# Patient Record
Sex: Male | Born: 1950 | Race: White | Hispanic: No | Marital: Married | Smoking: Never smoker
Health system: Southern US, Community
[De-identification: ages and names within clinical notes are randomized; demographics above are authoritative.]

## PROBLEM LIST (undated history)

## (undated) DIAGNOSIS — I219 Acute myocardial infarction, unspecified: Secondary | ICD-10-CM

## (undated) DIAGNOSIS — D128 Benign neoplasm of rectum: Secondary | ICD-10-CM

## (undated) DIAGNOSIS — F419 Anxiety disorder, unspecified: Secondary | ICD-10-CM

## (undated) DIAGNOSIS — K760 Fatty (change of) liver, not elsewhere classified: Secondary | ICD-10-CM

## (undated) DIAGNOSIS — I509 Heart failure, unspecified: Secondary | ICD-10-CM

## (undated) DIAGNOSIS — I2510 Atherosclerotic heart disease of native coronary artery without angina pectoris: Secondary | ICD-10-CM

## (undated) DIAGNOSIS — E785 Hyperlipidemia, unspecified: Secondary | ICD-10-CM

## (undated) DIAGNOSIS — I639 Cerebral infarction, unspecified: Secondary | ICD-10-CM

## (undated) DIAGNOSIS — F329 Major depressive disorder, single episode, unspecified: Secondary | ICD-10-CM

## (undated) DIAGNOSIS — F32A Depression, unspecified: Secondary | ICD-10-CM

## (undated) DIAGNOSIS — I10 Essential (primary) hypertension: Secondary | ICD-10-CM

## (undated) DIAGNOSIS — K611 Rectal abscess: Secondary | ICD-10-CM

## (undated) HISTORY — DX: Acute myocardial infarction, unspecified: I21.9

## (undated) HISTORY — DX: Major depressive disorder, single episode, unspecified: F32.9

## (undated) HISTORY — DX: Cerebral infarction, unspecified: I63.9

## (undated) HISTORY — DX: Atherosclerotic heart disease of native coronary artery without angina pectoris: I25.10

## (undated) HISTORY — DX: Rectal abscess: K61.1

## (undated) HISTORY — DX: Fatty (change of) liver, not elsewhere classified: K76.0

## (undated) HISTORY — DX: Benign neoplasm of rectum: D12.8

## (undated) HISTORY — PX: RECTAL SURGERY: SHX760

## (undated) HISTORY — DX: Hyperlipidemia, unspecified: E78.5

## (undated) HISTORY — DX: Depression, unspecified: F32.A

## (undated) HISTORY — DX: Anxiety disorder, unspecified: F41.9

## (undated) HISTORY — DX: Heart failure, unspecified: I50.9

## (undated) HISTORY — DX: Essential (primary) hypertension: I10

## (undated) HISTORY — PX: HERNIA REPAIR: SHX51

---

## 1998-11-13 DIAGNOSIS — I219 Acute myocardial infarction, unspecified: Secondary | ICD-10-CM

## 1998-11-13 HISTORY — DX: Acute myocardial infarction, unspecified: I21.9

## 2003-11-14 HISTORY — PX: CORONARY ANGIOPLASTY WITH STENT PLACEMENT: SHX49

## 2004-11-13 DIAGNOSIS — I639 Cerebral infarction, unspecified: Secondary | ICD-10-CM

## 2004-11-13 HISTORY — DX: Cerebral infarction, unspecified: I63.9

## 2006-07-06 ENCOUNTER — Ambulatory Visit: Payer: Self-pay | Admitting: Internal Medicine

## 2006-07-13 ENCOUNTER — Ambulatory Visit: Payer: Self-pay | Admitting: Internal Medicine

## 2006-07-24 ENCOUNTER — Encounter: Payer: Self-pay | Admitting: Cardiology

## 2006-07-24 ENCOUNTER — Ambulatory Visit: Payer: Self-pay | Admitting: Cardiology

## 2006-07-24 ENCOUNTER — Ambulatory Visit: Payer: Self-pay

## 2006-07-26 ENCOUNTER — Ambulatory Visit: Payer: Self-pay | Admitting: Gastroenterology

## 2006-07-30 ENCOUNTER — Ambulatory Visit: Payer: Self-pay | Admitting: Internal Medicine

## 2006-08-13 ENCOUNTER — Ambulatory Visit: Payer: Self-pay | Admitting: Cardiology

## 2006-08-13 DIAGNOSIS — D128 Benign neoplasm of rectum: Secondary | ICD-10-CM

## 2006-08-13 HISTORY — DX: Benign neoplasm of rectum: D12.8

## 2006-08-17 ENCOUNTER — Ambulatory Visit: Payer: Self-pay | Admitting: Gastroenterology

## 2006-08-17 ENCOUNTER — Encounter (INDEPENDENT_AMBULATORY_CARE_PROVIDER_SITE_OTHER): Payer: Self-pay | Admitting: Specialist

## 2006-08-30 ENCOUNTER — Ambulatory Visit: Payer: Self-pay | Admitting: Cardiology

## 2006-09-20 ENCOUNTER — Ambulatory Visit: Payer: Self-pay | Admitting: *Deleted

## 2006-09-25 ENCOUNTER — Ambulatory Visit: Payer: Self-pay | Admitting: Cardiology

## 2006-10-01 ENCOUNTER — Ambulatory Visit: Payer: Self-pay | Admitting: Cardiology

## 2006-10-11 ENCOUNTER — Ambulatory Visit: Payer: Self-pay | Admitting: Cardiology

## 2006-11-14 ENCOUNTER — Ambulatory Visit: Payer: Self-pay | Admitting: Family Medicine

## 2006-11-22 ENCOUNTER — Ambulatory Visit: Payer: Self-pay | Admitting: Family Medicine

## 2006-11-28 ENCOUNTER — Ambulatory Visit: Payer: Self-pay | Admitting: Family Medicine

## 2006-11-28 LAB — CONVERTED CEMR LAB
ALT: 38 units/L (ref 0–53)
Alkaline Phosphatase: 48 units/L (ref 39–117)
Basophils Absolute: 0 10*3/uL (ref 0.0–0.1)
Eosinophils Relative: 2 % (ref 0–5)
HCT: 43 % (ref 39.0–52.0)
Hemoglobin: 14.6 g/dL (ref 13.0–17.0)
LDL Cholesterol: 43 mg/dL (ref 0–99)
MCHC: 34 g/dL (ref 30.0–36.0)
Monocytes Absolute: 0.5 10*3/uL (ref 0.2–0.7)
PSA: 0.46 ng/mL (ref 0.10–4.00)
RDW: 12.2 % (ref 11.5–14.0)
Sodium: 133 meq/L — ABNORMAL LOW (ref 135–145)
Total Bilirubin: 0.8 mg/dL (ref 0.3–1.2)
Total Protein: 7 g/dL (ref 6.0–8.3)
VLDL: 33 mg/dL (ref 0–40)

## 2006-12-03 ENCOUNTER — Encounter: Payer: Self-pay | Admitting: Gastroenterology

## 2006-12-03 LAB — CONVERTED CEMR LAB
OCCULT 1: NEGATIVE
OCCULT 5: NEGATIVE

## 2006-12-04 ENCOUNTER — Encounter: Payer: Self-pay | Admitting: Family Medicine

## 2006-12-04 DIAGNOSIS — Z860100 Personal history of colon polyps, unspecified: Secondary | ICD-10-CM | POA: Insufficient documentation

## 2006-12-04 DIAGNOSIS — F3289 Other specified depressive episodes: Secondary | ICD-10-CM | POA: Insufficient documentation

## 2006-12-04 DIAGNOSIS — E785 Hyperlipidemia, unspecified: Secondary | ICD-10-CM

## 2006-12-04 DIAGNOSIS — G5600 Carpal tunnel syndrome, unspecified upper limb: Secondary | ICD-10-CM

## 2006-12-04 DIAGNOSIS — I509 Heart failure, unspecified: Secondary | ICD-10-CM | POA: Insufficient documentation

## 2006-12-04 DIAGNOSIS — F329 Major depressive disorder, single episode, unspecified: Secondary | ICD-10-CM

## 2006-12-04 DIAGNOSIS — J309 Allergic rhinitis, unspecified: Secondary | ICD-10-CM | POA: Insufficient documentation

## 2006-12-04 DIAGNOSIS — I6350 Cerebral infarction due to unspecified occlusion or stenosis of unspecified cerebral artery: Secondary | ICD-10-CM | POA: Insufficient documentation

## 2006-12-04 DIAGNOSIS — I2510 Atherosclerotic heart disease of native coronary artery without angina pectoris: Secondary | ICD-10-CM | POA: Insufficient documentation

## 2006-12-04 DIAGNOSIS — I252 Old myocardial infarction: Secondary | ICD-10-CM | POA: Insufficient documentation

## 2006-12-04 DIAGNOSIS — I10 Essential (primary) hypertension: Secondary | ICD-10-CM | POA: Insufficient documentation

## 2006-12-04 DIAGNOSIS — Q211 Atrial septal defect: Secondary | ICD-10-CM

## 2006-12-05 ENCOUNTER — Ambulatory Visit: Payer: Self-pay | Admitting: Family Medicine

## 2006-12-19 ENCOUNTER — Ambulatory Visit: Payer: Self-pay | Admitting: Cardiology

## 2006-12-25 ENCOUNTER — Ambulatory Visit: Payer: Self-pay | Admitting: Family Medicine

## 2006-12-25 DIAGNOSIS — I2589 Other forms of chronic ischemic heart disease: Secondary | ICD-10-CM

## 2006-12-25 DIAGNOSIS — E871 Hypo-osmolality and hyponatremia: Secondary | ICD-10-CM | POA: Insufficient documentation

## 2006-12-25 DIAGNOSIS — G4700 Insomnia, unspecified: Secondary | ICD-10-CM

## 2006-12-25 DIAGNOSIS — F4321 Adjustment disorder with depressed mood: Secondary | ICD-10-CM

## 2006-12-25 LAB — CONVERTED CEMR LAB
Cholesterol, target level: 200 mg/dL
LDL Goal: 100 mg/dL

## 2007-01-11 ENCOUNTER — Telehealth (INDEPENDENT_AMBULATORY_CARE_PROVIDER_SITE_OTHER): Payer: Self-pay | Admitting: Family Medicine

## 2007-01-11 ENCOUNTER — Ambulatory Visit: Payer: Self-pay | Admitting: Family Medicine

## 2007-01-11 LAB — CONVERTED CEMR LAB: Prothrombin Time: 22.3 s

## 2007-01-16 ENCOUNTER — Ambulatory Visit: Payer: Self-pay | Admitting: Family Medicine

## 2007-01-31 ENCOUNTER — Encounter (INDEPENDENT_AMBULATORY_CARE_PROVIDER_SITE_OTHER): Payer: Self-pay | Admitting: Family Medicine

## 2007-01-31 ENCOUNTER — Telehealth (INDEPENDENT_AMBULATORY_CARE_PROVIDER_SITE_OTHER): Payer: Self-pay | Admitting: Family Medicine

## 2007-02-01 ENCOUNTER — Ambulatory Visit: Payer: Self-pay | Admitting: Cardiology

## 2007-02-01 ENCOUNTER — Observation Stay (HOSPITAL_COMMUNITY): Admission: EM | Admit: 2007-02-01 | Discharge: 2007-02-01 | Payer: Self-pay | Admitting: Emergency Medicine

## 2007-02-01 ENCOUNTER — Encounter (INDEPENDENT_AMBULATORY_CARE_PROVIDER_SITE_OTHER): Payer: Self-pay | Admitting: Family Medicine

## 2007-02-04 ENCOUNTER — Encounter (INDEPENDENT_AMBULATORY_CARE_PROVIDER_SITE_OTHER): Payer: Self-pay | Admitting: Family Medicine

## 2007-02-08 ENCOUNTER — Telehealth (INDEPENDENT_AMBULATORY_CARE_PROVIDER_SITE_OTHER): Payer: Self-pay | Admitting: Internal Medicine

## 2007-02-15 ENCOUNTER — Telehealth (INDEPENDENT_AMBULATORY_CARE_PROVIDER_SITE_OTHER): Payer: Self-pay | Admitting: Family Medicine

## 2007-02-19 ENCOUNTER — Ambulatory Visit: Payer: Self-pay | Admitting: Family Medicine

## 2007-02-21 ENCOUNTER — Ambulatory Visit: Payer: Self-pay | Admitting: Cardiology

## 2007-02-27 ENCOUNTER — Ambulatory Visit: Payer: Self-pay

## 2007-02-27 ENCOUNTER — Encounter (INDEPENDENT_AMBULATORY_CARE_PROVIDER_SITE_OTHER): Payer: Self-pay | Admitting: Family Medicine

## 2007-03-01 ENCOUNTER — Telehealth (INDEPENDENT_AMBULATORY_CARE_PROVIDER_SITE_OTHER): Payer: Self-pay | Admitting: Family Medicine

## 2007-03-06 ENCOUNTER — Ambulatory Visit: Payer: Self-pay | Admitting: Family Medicine

## 2007-03-06 LAB — CONVERTED CEMR LAB: INR: 3.6

## 2007-03-08 ENCOUNTER — Ambulatory Visit: Payer: Self-pay | Admitting: Cardiology

## 2007-03-15 ENCOUNTER — Telehealth (INDEPENDENT_AMBULATORY_CARE_PROVIDER_SITE_OTHER): Payer: Self-pay | Admitting: Family Medicine

## 2007-03-15 ENCOUNTER — Ambulatory Visit: Payer: Self-pay | Admitting: Family Medicine

## 2007-03-15 LAB — CONVERTED CEMR LAB
INR: 2
Prothrombin Time: 17.6 s

## 2007-03-22 ENCOUNTER — Ambulatory Visit: Payer: Self-pay | Admitting: Family Medicine

## 2007-03-22 DIAGNOSIS — F319 Bipolar disorder, unspecified: Secondary | ICD-10-CM | POA: Insufficient documentation

## 2007-03-22 LAB — CONVERTED CEMR LAB: INR: 1.5

## 2007-03-25 ENCOUNTER — Ambulatory Visit: Payer: Self-pay | Admitting: Family Medicine

## 2007-03-26 ENCOUNTER — Telehealth (INDEPENDENT_AMBULATORY_CARE_PROVIDER_SITE_OTHER): Payer: Self-pay | Admitting: Family Medicine

## 2007-03-27 ENCOUNTER — Telehealth (INDEPENDENT_AMBULATORY_CARE_PROVIDER_SITE_OTHER): Payer: Self-pay | Admitting: Family Medicine

## 2007-04-01 ENCOUNTER — Ambulatory Visit: Payer: Self-pay | Admitting: Family Medicine

## 2007-04-01 LAB — CONVERTED CEMR LAB: Prothrombin Time: 24.6 s

## 2007-04-05 ENCOUNTER — Telehealth (INDEPENDENT_AMBULATORY_CARE_PROVIDER_SITE_OTHER): Payer: Self-pay | Admitting: Family Medicine

## 2007-04-11 ENCOUNTER — Telehealth (INDEPENDENT_AMBULATORY_CARE_PROVIDER_SITE_OTHER): Payer: Self-pay | Admitting: Family Medicine

## 2007-04-29 ENCOUNTER — Telehealth (INDEPENDENT_AMBULATORY_CARE_PROVIDER_SITE_OTHER): Payer: Self-pay | Admitting: Family Medicine

## 2007-05-01 ENCOUNTER — Telehealth (INDEPENDENT_AMBULATORY_CARE_PROVIDER_SITE_OTHER): Payer: Self-pay | Admitting: Family Medicine

## 2007-05-03 ENCOUNTER — Encounter (INDEPENDENT_AMBULATORY_CARE_PROVIDER_SITE_OTHER): Payer: Self-pay | Admitting: Family Medicine

## 2007-05-03 ENCOUNTER — Encounter: Payer: Self-pay | Admitting: Family Medicine

## 2007-05-03 ENCOUNTER — Ambulatory Visit: Payer: Self-pay | Admitting: Family Medicine

## 2007-05-10 ENCOUNTER — Telehealth (INDEPENDENT_AMBULATORY_CARE_PROVIDER_SITE_OTHER): Payer: Self-pay | Admitting: Family Medicine

## 2007-05-10 ENCOUNTER — Encounter (INDEPENDENT_AMBULATORY_CARE_PROVIDER_SITE_OTHER): Payer: Self-pay | Admitting: Family Medicine

## 2007-08-27 ENCOUNTER — Emergency Department (HOSPITAL_COMMUNITY): Admission: EM | Admit: 2007-08-27 | Discharge: 2007-08-27 | Payer: Self-pay | Admitting: Emergency Medicine

## 2007-09-13 ENCOUNTER — Ambulatory Visit: Payer: Self-pay | Admitting: *Deleted

## 2007-09-13 ENCOUNTER — Emergency Department (HOSPITAL_COMMUNITY): Admission: EM | Admit: 2007-09-13 | Discharge: 2007-09-14 | Payer: Self-pay | Admitting: Emergency Medicine

## 2007-09-14 ENCOUNTER — Inpatient Hospital Stay (HOSPITAL_COMMUNITY): Admission: RE | Admit: 2007-09-14 | Discharge: 2007-09-18 | Payer: Self-pay | Admitting: *Deleted

## 2011-03-28 NOTE — Assessment & Plan Note (Signed)
[facility]                            CARDIOLOGY OFFICE NOTE   NAME:[name], [name]                       MRN:          [id number]  DATE:03/08/2007                            DOB:          [birth year]    PRIMARY CARE PHYSICIAN:  Dr. Amador Cunas.   REASON FOR PRESENTATION:  Evaluate patient with coronary disease and  recent chest pain.   HISTORY OF PRESENT ILLNESS:  Patient presents for followup of the above.  He had a stress perfusion study on April 16 that demonstrated an old  apical and anteroseptal infarct with an ejection fraction of about 38%.  This was not changed from previous. He has had no new complaints since I  last saw. He has had no severe symptoms of chest discomfort of shortness  of breath. He is still under considerable amount of stress.   PAST MEDICAL HISTORY:  Coronary artery disease ( see the February 21, 2007  note for details), cardiomyopathy (ejection fraction approximately 40%),  previously cerebrovascular accident, dyslipidemia, right knee surgery,  tonsillectomy, bilateral hernia repair, obsessive compulsive disorder,  anxiety.   ALLERGIES:  ERYTHROMYCIN AND PENICILLIN.   MEDICATIONS:  1. Aspirin 81 mg daily.  2. Omega III.  3. Coumadin.  4. Niaspan 500 mg daily.  5. Simvastatin 20 mg daily.  6. Coreg 25 mg b.i.d.  7. Zoloft 50 mg daily.  8. Lisinopril 10 mg b.i.d.   REVIEW OF SYSTEMS:  As stated in the HPI and otherwise negative for  other systems.   PHYSICAL EXAMINATION:  Patient is in no distress. Blood pressure 116/76,  heart rate 67 and regular, weight 179, body mass index 28.  NECK: No jugular venous distension, wave form within normal limits.  Carotid upstrokes brisk and symmetric. No bruits or thyromegaly.  LYMPHATICS: No cervical, axillary, or inguinal adenopathy.  LUNGS: Clear to auscultation bilaterally.  BACK: No costovertebral angle tenderness.  CHEST: Unremarkable.  HEART: PMI not displaced or  sustained. S1 and S2 within normal limits.  No S3. No S4, clicks, rubs, or murmurs.  ABDOMEN: Flat, positive bowel sounds normal in frequency and pitch. No  bruits, rebound, or guarding.  SKIN: No rashes. No nodules.  EXTREMITIES: 2 + pulses throughout. No edema.  NEURO: Grossly intact.   1. Coronary disease, patient has no new symptoms. The Cardiolite would      not suggest new high grade obstructive coronary disease requiring      catheterization. No further cardiovascular testing is suggested. He      will continue with secondary risk reduction.  2. Cardiomyopathy, he is on a reasonable medical regimen. I will leave      him on this regimen.  3. Status post CVA, the patient has had CVA and is on chronic Coumadin      therapy. He will continue with this.  4. Follow up, I will see the patient back in 6 months or sooner if      needed.     Rollene Rotunda, MD, [facility]  Electronically Signed    JH/MedQ  DD: 03/08/2007  DT: 03/08/2007  Job #: [id number]  cc:   Gordy Savers, MD

## 2011-03-28 NOTE — H&P (Signed)
NAMEJADAKISS, BARISH NO.:  [id number]   MEDICAL RECORD NO.:  [id number]          PATIENT TYPE:  IPS   LOCATION:  0604                          FACILITY:  [facility]   PHYSICIAN:  Vic Ripper, P.A.-C.DATE OF BIRTH:  May 10, 1951   DATE OF ADMISSION:  09/14/2007  DATE OF DISCHARGE:                       PSYCHIATRIC ADMISSION ASSESSMENT   REASON FOR ADMISSION:  This is a voluntary admission to the services of  Dr. Milford Cage.   IDENTIFYING INFORMATION/JUSTIFICATION FOR ADMISSION AND CARE:  This is a  59 year old, married, white male.  He presented to [facility],  reporting suicidal ideation.  He stated that he quit his job this past  spring because of severe stress.  He does not know what he wants to do  with his life.  He thinks he wants to die so he will not have to deal  with it, but he had no plan.  He has recently come into care with Dr.  Betti Cruz, and sees the therapist, Zenia Resides.  Prior to that 10 years ago  he was diagnosed with OCD.  He was treated in the past with Anafranil in  [place].  It is not clear when he stopped treatment for his OCD.   PAST PSYCHIATRIC HISTORY:  He has no prior in-patient.  As already  stated, he began seeing Dr. Betti Cruz a few months ago.  He is also in  therapy with Zenia Resides, also in that office.   SOCIAL HISTORY:  He obtained his B.S. in 1979.  He used to be employed  in [employer].  He has been married once.  He has a 46-year-old son.  He  has not been employed since June.  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  His wife is employed as an Charity fundraiser.  He has  difficulty interacting and being there for his 16-year-old son, who is  ADHD.   FAMILY HISTORY:  His mother was hospitalized in the 49s for depression  and anxiety.   ALCOHOL AND DRUG HISTORY:  He denies.   PRIMARY CARE PHYSICIAN:  Dr. Sherwood Gambler.  His psychiatrist is Dr. Betti Cruz.   PAST MEDICAL HISTORY:  He is status-post an MI in 2000, with  stent  placement and a CVA in 2006, with no residual motor or sensory loss.   CURRENT MEDICATIONS:  1. Niaspan 500 mg p.o. q daily.  2. Enteric-coated aspirin 81 mg p.o. q daily.  3. Coreg 25 mg p.o.q. daily.  4. Lisinopril 10 mg p.o.q. daily.  5. Coumadin 5 mg daily, and Saturday and Sunday 2.5 mg.  6. Remeron 30 mg at h.s.  7. Risperdal 1 mg at h.s.  8. Lexapro 10 mg p.o.q. daily.   ALLERGIES:  AMOXICILLIN OR ERYTHROMYCIN, he cannot remember which one.  One of them gives him hives.   LABORATORY DATA:  His labs at [facility] were unremarkable.   POSITIVE PHYSICAL FINDINGS:  VITAL SIGNS:  Height 5 foot 6 inches,  weight 172, temperature 98.5, blood pressure 115/74 to 126/73, pulse 76  to 83, respirations 18.  IN GENERAL:  He is status-post a  stent placement in 2006, and MI in  2000.  THYROID:  He has not had his thyroid checked yet.  We will order that to  check and make sure that he does not have any thyroid issues.   MENTAL STATUS EXAM:  IN GENERAL:  He is alert and oriented.  He is  appropriately groomed, dressed, and nourished, albeit he is in hospital  scrubs.  His speech is limited.  He does not give very verbose answers.  His mood is depressed.  His affect is unusual.  He has very poor eye  contact, and he just has an odd countenance.  Thought processes are  clear, rational, and goal-oriented.  He wants to know what to do with  his life.  Judgment and insight are fair.  Concentration and memory are  intact.  Intelligence is at least average.  He states that today he does  not feel that he is suicidal.  He was never homicidal, and he does not  have auditory or visual hallucinations.   ADMISSION DIAGNOSES:   AXIS I:  Major depressive disorder.  He gives a history for obsessive-  compulsive disorder.   AXIS II:  Deferred.   AXIS III:  Myocardial infarction in 2000.  Stent placement in 2005.  Cerebrovascular accident in 2006.   AXIS IV:  Severe.   AXIS V:  30.    PLAN:  1. The plan is to admit for safety and stabilization.  2. Will adjust his meds.  Toward that end, we will go ahead and      increase the Risperdal to 2 mg M-Tabs at h.s., increase the Lexapro      to 20 mg p.o.q. daily, and will continue the Remeron at 30.  3. Will have the case manager talk with his therapist regarding      vocational rehab and other services once discharged.   ESTIMATED BLOOD LOSS:  Five to 6 days.      Vic Ripper, P.A.-C.     MD/MEDQ  D:  09/14/2007  T:  09/15/2007  Job:  [id number]

## 2011-03-28 NOTE — Discharge Summary (Signed)
NAMEVIRAAT, VANPATTEN NO.:  [id number]   MEDICAL RECORD NO.:  [id number]           PATIENT TYPE:   LOCATION:                                 FACILITY:   PHYSICIAN:  Jasmine Pang, M.D. DATE OF BIRTH:  [birth year]   DATE OF ADMISSION:  09/13/2007  DATE OF DISCHARGE:  09/18/2007                               DISCHARGE SUMMARY   IDENTIFICATION:  This is a voluntary admission on September 13, 2007, for  this 60 year old married white male.   HISTORY OF PRESENT ILLNESS:  The patient presented to [facility] reporting suicidal ideation.  He states that he quit his job  this past spring because of severe stress.  He does not know what he  wants to do with his life.  He thinks he wants to die, so he will not  have to deal with it, but he has no plan.  He has recently come into the  care of Dr. Betti Cruz and sees therapist Tami Ribas in Dr. Reddy's office.  Prior to that 10 years ago, he was diagnosed with OCD.  He was treated  in the past with Anafranil in [place].  It is not clear when he  stopped treatment for his OCD.  He has no prior inpatient treatment.  As  already stated, he began seeing Dr. Betti Cruz a few months ago.  He is also  in therapy with Tami Ribas in that office.  His mother was hospitalized  in the 9s for depression and anxiety.  No other family history noted.  He is status post an MI in 2000 with stent placement and a CVA in 2006  with no residual motor or sensory loss.   He is currently on:  1. Niaspan 500 mg daily.  2. Enteric-coated aspirin 81 mg daily.  3. Coreg 25 mg daily.  4. Lisinopril 10 mg daily.  5. Coumadin 5 mg daily and on Saturday and Sunday 2.5 mg.  6. Remeron 30 mg at bedtime.  7. Risperdal 1 mg at bedtime.  8. Lexapro 10 mg daily.   He is allergic to AMOXICILLIN or ERYTHROMYCIN, he cannot remember which  one.  One of them gives him hives.   PHYSICAL FINDINGS:  There were no acute physical problems noted.   His  admission labs were done in the ED and reviewed by the ED physician.   HOSPITAL COURSE:  Upon admission, the patient was started on Restoril 30  mg p.o. q.h.s. p.r.n. insomnia.  On September 14, 2007, he was started on  Remeron 30 mg p.o. q.h.s., Lexapro 20 mg p.o. daily, and Coumadin 5 mg  p.o. daily.  This was monitored by the pharmacist during the  hospitalization here.  On September 15, 2007, he was started on Ativan 0.5  mg now then 0.5 mg q. 6h. p.r.n. anxiety, and the patient tolerated  these medications well with no side effects other than some sedation.  He was friendly and cooperative in individual sessions with me.  He was  able to participate appropriately in unit therapeutic groups and  activities.  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  He reports feeling increasingly depressed.  He is in  treatment at [facility] and has been seen by his  therapist Tami Ribas several times a week in order to try to forestall  this depression and hospitalization.  He admitted to positive  hopelessness, worthlessness, anhedonia, and anergia.  As hospitalization  progressed, the patient was still depressed and anxious at times I  don't feel like living anymore.  However, he contracted for safety  here.  On September 16, 2007, the patient had become less depressed and  less anxious.  There was no suicidal ideation.  He was feeling better.  He stated he was still anxious about possibly doing vocational rehab and  wondering if it could help.  He also discussed the possibility of  getting on disability and stated that he had been considering this.  He  discussed his wife and sister who were very supportive.  His wife came  to visit with his 85-year-old son.  The patient continued to improve as  hospitalization progressed.  He was no longer suicidal.  Appetite was  good and sleep was good.  He enjoyed the visit with his wife and son.  He wanted a  family session before he left, and this was arranged for 5  o'clock on September 18, 2007.  On September 18, 2007, the patient's mental  status had improved markedly from admission status.  The patient was  friendly and cooperative with good eye contact.  Speech was normal rate  and flow.  Psychomotor activity was within normal limits.  Mood was  euthymic.  Affect wide range.  There was no suicidal or homicidal  ideation.  No thoughts of self-injurious behavior.  No auditory or  visual hallucinations.  No paranoia or delusions.  Thoughts were logical  and goal directed.  Thought content, no predominant theme.  Cognitive  was grossly back to baseline.  It was felt that he was safe to be  discharged today.  He will return home after his family session and was  rescheduled with [facility] with Tami Ribas and Dr.  Betti Cruz.   DISCHARGE DIAGNOSES:  AXIS I:  Major depressive disorder, recurrent,  severe, and history of obsessive-compulsive disorder.  AXIS II:  None.  AXIS III:  Myocardial infarction in 2000 and cerebrovascular accident in  2006.  AXIS IV:  Severe (problems with primary support group, problems related  to social environment, burden of psychiatric illness, and burden of  medical illness).  AXIS V:  Global assessment of functioning was 50 upon discharge, 30 upon  admission, global assessment of functioning was 60-65 highest past year.   DISCHARGE PLANS:  There were no specific activity level or dietary  restrictions.   POST-HOSPITAL CARE PLANS:  The patient will see Tami Ribas, his  therapist, on September 23, 2007, at 5 p.m. and Dr. Betti Cruz on October 02, 2007, at 5 p.m.   DISCHARGE MEDICATIONS:  1. Remeron 30 mg p.o. q.h.s.  2. Lexapro 20 mg daily.  3. Risperdal 2 mg at bedtime.  4. Coreg 12.5 mg 2 pills twice daily.  5. Prinivil 20 mg daily.  6. Aspirin 81 mg daily.  7. Niacin 500 mg daily.  8. Crestor 20 mg daily.  9. Coumadin 5 mg daily.       Jasmine Pang, M.D.  Electronically Signed     BHS/MEDQ  D:  09/18/2007  T:  09/19/2007  Job:  [id number]

## 2011-03-31 NOTE — Discharge Summary (Signed)
[name] [name], LEDO NO.:  [id number]   MEDICAL RECORD NO.:  [id number]          PATIENT TYPE:  INP   LOCATION:  A211                          FACILITY:  [facility]   PHYSICIAN:  Osvaldo Shipper, MD     DATE OF BIRTH:  [birth year]   DATE OF ADMISSION:  02/01/2007  DATE OF DISCHARGE:  03/21/2008LH                               DISCHARGE SUMMARY   PRIMARY CARE PHYSICIAN:  Dr. Erby Pian.   CARDIOLOGIST:  The patient is followed by [facility], Dr.  Antoine Poche.   DISCHARGE DIAGNOSES:  1. Chest pain, ruled out for acute coronary syndrome, requiring      outpatient cardiac followup.  2. History of coronary artery disease, status post myocardial      infarction in the past with stent placements.  3. History of cerebrovascular accident in the past.   HISTORY OF PRESENT ILLNESS:  Please see H&P dictated by Dr. Benson Setting.   BRIEF HOSPITAL COURSE:  This is a 60 year old Caucasian male who  presented with left-sided chest pain.  He presented to the ED and was  given nitroglycerin and his chest pain resolved.  This morning, he is  again comfortable with no complaints, no shortness of breath or chest  pain, or palpitations.  His EKG was reviewed and was found to have old  changes with no acute abnormalities.  He was also seen by Dr. Dietrich Pates  from [facility] and he mentioned that if the patient's 3rd  marker is negative, he may be discharged home.  We are awaiting the 3rd  marker; the first 2 have been negative.  His blood work is unremarkable.  His INR is elevated because he is on Coumadin.  The patient is agreeable  with the above plan.  A stress echo will be arranged on Monday with Dr.  Antoine Poche.   MEDICATIONS:  No changes are made to any of his home medications; please  refer H&P for a complete list and includes Coumadin, Coreg, lisinopril  and aspirin, and also Niaspan.   FOLLOWUP:  Follow up with Dr. Antoine Poche for a stress echo on Monday.   DIET:   Heart-healthy.   PHYSICAL ACTIVITY:  No restrictions, but to increase activity slowly.   DISPOSITION:  The patient may be discharged if his labs are negative.   COMMENT:  The above is preliminary until signed.   DISCHARGE PROCESS:  Total time at discharge less than 30 minutes.      Osvaldo Shipper, MD  Electronically Signed     GK/MEDQ  D:  02/01/2007  T:  02/01/2007  Job:  [id number]   cc:   Gerrit Friends. Dietrich Pates, MD, [facility]  [address]  [place], [place] [address]   Rollene Rotunda, MD, [facility]  [address]  [address]  [place]  [place] [address]   Franchot Heidelberg, M.D.

## 2011-03-31 NOTE — Assessment & Plan Note (Signed)
[facility]                           GASTROENTEROLOGY OFFICE NOTE   NAME:Luevanos, Jery                       MRN:          [id number]  DATE:07/26/2006                            DOB:          03-15-1951    OFFICE CONSULTATION:   REFERRING PHYSICIAN:  Gordy Savers, M.D.   REASON FOR REFERRAL:  Family history of colon polyps and Coumadin  anticoagulation.   HISTORY OF PRESENT ILLNESS:  Mr. Debruin is a 60 year old white male  referred by Dr. Amador Cunas.  He has recently established care with him.  Please see his note dated July 06, 2006.  Mr. Mcgrady is status post an  anterior wall myocardial infarction in June of 2000, which was complicated  by re-infarction and cardiogenic shock.  He has a low ejection fraction  around 30-35%.  He had a TAXUS stent placed in November of 2005 and was  placed on Plavix.  In June of 2006, he had a cerebrovascular event thought  to be cardioembolic.  Plavix was discontinued, and Coumadin was stopped.  It  is suspected that he had a cardiac source of his cerebrovascular event from  left ventricular dysfunction.  He is followed by Dr. Antoine Poche, and  apparently recently had an echocardiogram.  His father has had multiple  colon polyps, and the patient states he has a colonoscopy done about every 1-  2 years.  He states his father also had some form of colitis, which may have  been ulcerative colitis.  No other family members with colon polyps, colon  cancer of inflammatory bowel disease.  The patient has no gastrointestinal  complaints, and specifically denies any change in bowel habits, change in  stool caliber, melena, hematochezia, abdominal pain, rectal pain,  constipation or diarrhea.  He has not previously had colonoscopy.   PAST MEDICAL HISTORY:  1. Coronary artery disease, status post myocardial infarction, status post      TAXUS coronary stent placement.  2. Left ventricular dysfunction with  ejection fraction between 30 and 35%.  3. Status post cardioembolic cerebrovascular accident in June of 2006.  4. Depression in 2006.  5. Status post inguinal hernia surgery in 1985.  6. Status post perirectal abscess surgery in 1998 and again in 1991.   CURRENT MEDICATIONS:  Listed on the chart.   MEDICATION ALLERGIES:  1. AMOXICILLIN.  2. ERYTHROMYCIN.   SOCIAL HISTORY/REVIEW OF SYSTEMS:  Per the handwritten evaluation form.   PHYSICAL EXAMINATION:  GENERAL:  In no acute distress.  VITAL SIGNS:  Height 5 feet, 6 inches.  Weight 182.2 pounds.  Blood pressure  is 110/60, pulse 78 and regular.  HEENT:  Anicteric sclerae.  Oropharynx clear.  CHEST:  Clear to auscultation bilaterally.  CARDIAC:  Regular rate and rhythm without murmurs appreciated.  ABDOMEN:  Soft, nontender, nondistended.  Normoactive bowel sounds.  No  palpable organomegaly, masses or hernias.  RECTAL:  Deferred to the time of colonoscopy.  EXTREMITIES:  Without clubbing, cyanosis, or edema.  NEUROLOGIC:  Alert and oriented x3.  Grossly nonfocal.   ASSESSMENT AND PLAN:  1. First degree relative with multiple  colon polyps.  2. Coronary artery disease with a TAXUS stent in place.  3. History of a cardioembolic cerebrovascular accident on Coumadin      anticoagulation.   Risks, benefits, and alternatives to colonoscopy with possible biopsy and  polypectomy while off Coumadin anticoagulation discussed with the patient,  and he consents to proceed.  He may remain on aspirin 81 mg through the  procedure.  Will obtain clearance to hold his Coumadin for 3-5 days from  Drs. Hochrein and [employer]                                   Venita Lick. Russella Dar, MD, [facility]   MTS/MedQ  DD:  07/26/2006  DT:  07/27/2006  Job #:  [id number]   cc:   Rollene Rotunda, MD, [facility]  Gordy Savers, MD

## 2011-03-31 NOTE — Assessment & Plan Note (Signed)
[facility]                   COUMADIN / CHRONIC HEART FAILURE CLINIC NOTE   NAME:[name], [name]                       MRN:          [id number]  DATE:07/13/2006                            DOB:          01-08-1951    REASON FOR REFERRAL:  Evaluate patient with ischemic cardiomyopathy.   HISTORY OF PRESENT ILLNESS:  The patient is a pleasant, 60 year old  gentleman who is moving from [place], [place].  There, he had a  complicated cardiac history.  In June 2000, he apparently had an anterior MI  that was treated with thrombolytics.  I do not have the details of this, but  he reports that following this, he had a cath demonstrating a 50% LAD lesion  and he did not have angioplasty or stenting.  However, during that  hospitalization, he apparently had a reinfarction with cardiogenic shock and  a balloon pump.  He, subsequently, has been told he has an ejection fraction  of 35%.  He had a cardiac catheterization again in 2005.  At this time, he  received Taxus stenting to a diagonal lesion that was 80%.  Most recently in  June 2006, he had a CVA.  He had weakness, visual disturbance, and memory  loss.  He was placed on Coumadin at that time.  He was found to have a PFO.   The patient is now relocating.  He actually does well.  He says he feels  fine and says he can be quite active without bringing on any symptoms (class  I symptoms).  He denies any chest discomfort, neck discomfort, arm  discomfort, activity-induced nausea, vomiting, excessive diaphoresis.  He  has no palpitation, presyncope, or syncope.  He has no PND or orthopnea.   PAST MEDICAL HISTORY:  1. Coronary disease, as described (details pending).  2. Cardiomyopathy.  3. CVA.  4. Dyslipidemia.   PAST SURGICAL HISTORY:  1. Right knee surgery.  2. Tonsillectomy.  3. Bilateral hernia repair.   ALLERGIES:  1. ERYTHROMYCIN.  2. PENICILLIN.   MEDICATIONS:  1. Aspirin 81 mg q.  day.  2. Coreg CR 20 mg q. day.  3. Lisinopril 5 mg.  4. Vytorin 10/20.  5. Niaspan 1000 mg q.day.  6. Omega-3 fatty acid.  7. Coumadin.   SOCIAL HISTORY:  The patient is married.  His wife is a Engineer, civil (consulting).  He has one  son.  He does not smoke.  He does not drink alcohol.   FAMILY HISTORY:  Noncontributory for early coronary artery disease.  There  is later onset heart disease in first-degree relatives.   REVIEW OF SYMPTOMS:  As stated in the HPI and, otherwise, negative for all  other systems.   PHYSICAL EXAMINATION:  GENERAL:  The patient is in no distress.  VITAL SIGNS:  Blood pressure 131/80.  Heart rate 84 and regular.  Weight 184  pounds.  HEENT:  Eyes unremarkable.  Pupils equal round and reactive to light.  Fundi  within normal limits.  Oral mucosa unremarkable.  NECK:  No jugular venous distention at 45 degrees.  Carotid upstroke brisk  and symmetrical.  No bruits, thyromegaly.  LYMPHATICS:  No cervical, axillary, inguinal adenopathy.  LUNGS:  Clear to auscultation bilaterally.  BACK:  No costovertebral angle tenderness.  CHEST:  Unremarkable.  HEART:  PMI not displaced or sustained.  S1 and S2 within normal limits.  No  S3.  No S4.  No rubs.  No murmurs.  ABDOMEN:  Obese, positive bowel sounds, normal in frequency and pitch, no  bruits, no rebound, no guarding.  No midline pulse or mass, hepatomegaly,  splenomegaly.  SKIN:  No rashes, no nodules.  EXTREMITIES:  2+ pulses throughout.  No edema, no cyanosis, no clubbing.  NEUROLOGIC:  Oriented to person, place, and time.  Cranial nerves II through  XII grossly intact.  Motor grossly intact.   ASSESSMENT AND PLAN:  1. Cardiomyopathy.  The patient has cardiomyopathy but seems to be well-      compensated.  At this point, I would like him to be back on Coreg      immediate-release while we titrate his medicines.  I am going to      continue him on 6.25 mg b.i.d. because the first change I would like to      make is going up  on his lisinopril to 10 mg q.day.  The target for the      lisinopril will be 20 and for the Coreg 25 b.i.d.  We will get an      echocardiogram to assess his baseline left ventricular function.  2. Coronary disease.  He is going to continue on aggressive risk      reduction.  His dyslipidemia will be per Dr. Amador Cunas.  I am going      to ask him to sign a release of information so we can get his most      recent catheterization.  3. Cardiovascular accident.  I agree in this gentleman with a      cardiomyopathy and patent foramen ovale that he be on Coumadin.  We      discussed the fact that the data are not absolutely sound but that this      was a very reasonable standard of care along with a low dose of      aspirin.  He understands the risks as well as the benefits of this and      agrees to continue.  4. Followup.  We will see him back in a couple of weeks for his      echocardiogram and a follow-up medicine titration in the Heart Failure      Clinic.                               Rollene Rotunda, MD, [facility]    JH/MedQ  DD:  07/13/2006  DT:  07/13/2006  Job #:  [id number]   cc:   Gordy Savers, MD

## 2011-03-31 NOTE — H&P (Signed)
NAMEGILMORE, LIST              ACCOUNT NO.:  [id number]   MEDICAL RECORD NO.:  [id number]          PATIENT TYPE:  INP   LOCATION:  A211                          FACILITY:  [facility]   PHYSICIAN:  Marcello Moores, MD   DATE OF BIRTH:  1950/11/17   DATE OF ADMISSION:  01/31/2007  DATE OF DISCHARGE:  LH                              HISTORY & PHYSICAL   CHIEF COMPLAINT:  Chest pain.   HISTORY OF PRESENT ILLNESS:  [name] is a 60 year old man with  history of cardiovascular accident, coronary artery disease and history  of myocardial infarction, status post stent placement.  He presented  with left-sided chest pain since yesterday afternoon.  It was episodic  and pressure type radiating to the left upper extremities.  The patient  states that he had this episode 2-3 times yesterday and he decided to  come to the emergency room.  He denied any history of palpitations, no  sweating, no vomiting.  The patient gave history of myocardial  infarction in 2005, with the stent placement.  He has no abdominal or  urinary complaints.  Currently in the emergency, he has no chest pain  and it was relieved after he got nitroglycerin.   REVIEW OF SYSTEMS:  A 10-point review of system is noncontributory  except as detailed in the HPI.   ALLERGIES:  AMOXIL AND ERYTHROMYCIN.   SOCIAL HISTORY:  He is married and he works.  He denied any history of  smoking and drug abuse.  He is an occasional drinker.   PAST MEDICAL HISTORY:  1. Hypertension.  2. Cardiovascular accident.  3. Coronary artery disease with myocardial infarction, status post      stent placement.   HOME MEDICATIONS:  1. Coumadin 5 mg daily.  2. Coreg 25 mg twice a day.  3. Lisinopril 20 mg p.o. daily.  4. Aspirin 81 mg p.o. daily.   PHYSICAL EXAMINATION:  GENERAL:  The patient is lying in the emergency  room bed without any distress.  VITAL SIGNS: Blood pressure is 95/61 and temperature is 97.8, pulse rate  is 66 per minute  and respiratory rate is 18.  He is saturating 99%.  HEENT:  Pink conjunctivae.  Nonicteric sclera.  Pupils are equal and  reactive.  NECK:  Supple.  CHEST:  Good air entry bilaterally.  Clear.  CARDIAC:  S1-S2 regular.  No murmur.  ABDOMEN:  Soft, no organomegaly.  No area of tenderness.  Normoactive  bowel sounds.  EXTREMITIES:  No pedal edema.  Peripheral pulses positive.  MUSCULOSKELETAL:  Normal.  SKIN:  Normal texture and color.  NEUROLOGIC:  He is alert and oriented.   LABORATORY DATA AND X-RAY FINDINGS:  White blood cell count 7.6,  hemoglobin is 14.4 and hematocrit 41, platelet count is 196.  Sodium is  135, potassium 3.9, chloride 101, bicarb 25, BUN 18, creatinine 0.7,  calcium is 9.1.  The first cardiac enzymes are normal.  CK-MB less than  1, troponin less than 0.05.  PT/INR 26 and 2.3.  EKG is also normal sinus rhythm without any acute ST or T-wave changes.  ASSESSMENT/PLAN:  Chest pain to rule out acute coronary syndrome.  The  patient has a history of cerebrovascular accident as well as myocardial  infarction.  Will admit him for monitoring and will put him on monitor  and will send serial cardiac enzymes and EKG.  Tomorrow will do  echocardiogram and will consult cardiologist for further evaluation.  Will put him on nitroglycerin and aspirin and will continue his home  medications as before.  Otherwise, the patient is currently stable.      Marcello Moores, MD  Electronically Signed     MT/MEDQ  D:  02/01/2007  T:  02/01/2007  Job:  [id number]

## 2011-03-31 NOTE — Assessment & Plan Note (Signed)
[name] [name]                   COUMADIN / CHRONIC HEART FAILURE CLINIC NOTE   NAME:[name], [name]                       MRN:          [id number]  DATE:07/24/2006                            DOB:          May 02, 1951    [name] returns today for further evaluation of medication titration of  his congestive heart failure secondary to ischemic cardiomyopathy.  Mr.  [name] [name] just recently moved here from [name].  He has a history  of coronary artery disease, status post MI x2, status post CVA in 2006.  Mr.  [name] [name] established care here with Dr. Amador [name] and then saw Dr.  Antoine [name] and myself on July 13, 2006.  At that time, we had recommended  the patient increase his Coreg to 12.5 mg b.i.d.; apparently, he  misunderstood and did not increase it; he is still taking 6.25 mg b.i.d.  I  had ordered a 2-D echocardiogram on [name], which I have the results  of today, preliminary report showing an ejection fraction of 35%.  Mr.  [name] [name] seems very disappointed with this.  He states his last  echocardiogram done in July of 2006 showed an EF of 45% to 50% at that time.  Apparently, he brought records from [name], where he saw Dr.  [place]ia [name] out in [name] and apparently left the records with Dr. Vernon [name]  office and I do not have any records available regarding his cardiac history  for comparison.  [name] is denying any symptoms suggestive of volume  overload, orthopnea or PND.  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  He states he has been feeling quite well.  He hopes to resume his previous exercise program; he apparently was very  active on the elliptical and member of [employer] in [name].  He states since  he has been in transition here, he has not been able to exercise at all.   PAST MEDICAL HISTORY:  1. Coronary  artery disease.      a.     June 2000, anterior MI, treated with thrombolytics.  Following       this, the patient had a cath demonstrating a 50% LAD lesion with no       intervention; however, during that hospitalization, he re-infarcted       with cardiogenic shock and was placed on the balloon pump temporarily.      b.     A 2005 cardiac catheterization with an EF of 35%.  2. Status post CVA in June of 2006 with anticoagulation therapy with      Coumadin.      a.     The patient underwent a TEE and was found to have a PFO.  3. 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  Most recent      echocardiogram done today preliminarily showing an EF of 35% with      akinesis of the apical wall, akinesis with scarring of the entire      septal wall.  4. Dyslipidemia.   CURRENT MEDICATIONS:  1. Aspirin 81 mg.  2. Vytorin 10/20.  3. Niaspan 1000 mg.  4. Omega-3 1000 mg b.i.d.  5. Coumadin as directed.  6. Lisinopril 10 mg daily.  7. Coreg 6.25 mg b.i.d.   REVIEW OF SYSTEMS:  As stated above in history of present illness, otherwise  negative.   PHYSICAL EXAM:  VITAL SIGNS:  Weight 172.  Blood pressure 122/76 with a  heart rate of 78.  GENERAL:  Patient in no acute distress.  NECK:  No jugular venous distention noted.  LUNGS:  Clear to auscultation bilaterally.  CARDIOVASCULAR:  Exam reveals an S1 and S2, regular rate and rhythm.  ABDOMEN:  Soft and nontender.  Positive bowel sounds.  EXTREMITIES:  Lower extremities without clubbing, cyanosis or edema.  NEUROLOGICAL:  Patient alert and oriented x3.  Cranial nerves II-XII grossly  intact.   IMPRESSION:  Patient with stable class II heart failure/cardiomyopathy.   I am going to ask him to increase his Coreg to 12.5 mg b.i.d. today,  continue other medications.  The target for his lisinopril will be 20 mg and  Coreg will be 25 mg b.i.d.  Otherwise, we will follow the patient in 4 weeks  for further titration of  medications.                                 Dorian Pod, ACNP                            Rollene Rotunda, MD, [facility]   MB/MedQ  DD:  07/24/2006  DT:  07/25/2006  Job #:  [id number]

## 2011-03-31 NOTE — Assessment & Plan Note (Signed)
[facility]                            CARDIOLOGY OFFICE NOTE   NAME:[name] [name], [name] [name]                       MRN:          [id number]  DATE:02/21/2007                            DOB:          April 25, 1951    PRIMARY CARE PHYSICIAN:  Dr. Amador Cunas.   REASON FOR PRESENTATION:  Evaluate patient with chest pain and coronary  disease.   HISTORY OF PRESENT ILLNESS:  The patient is 60 years old.  Has a history  of coronary disease as described below.  He has a cardiomyopathy with an  EF of approximately 35%.  He was hospitalized on March 21 overnight for  chest discomfort.  He had no enzyme evidence of a myocardial infarction.  However, he did have T wave inversion as described by Dr. Dietrich Pates on  the admission note.  This was reported as V4 through V6, 1 and AVL.  Because of this he was to be set up for a stress echocardiogram.  However, we had great difficulty coordinating this with the patient.  We  made special arrangements for him to come in early and the staff to come  in before usual hours to do this test.  He was a no show for this.  We  tried to accommodate him with office appointments but he was a no show  for at least one of these.  Yesterday he finally came in to have a  stress echo but would not exercise.  During the course of this it has  been documented that he has been quite verbal and belligerent with the  staff.  He finally made the appointment to come see me in [place]  today.   The patient has had previous problems with anger and anxiety and  obsessive compulsive disorder.  He admits that this has been  particularly problematic recently.  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  He is finally seeing a  Psychologist and just the other day was started back on Zoloft which he  had taken in the past.  He said that since discharge he has not had any  of the chest discomfort that prompted his hospitalization.   He has not  had any chest pressure, neck or arm discomfort.  He has had no  palpitations, presyncope, or syncope.  He denies any PND or orthopnea.  He has not been physically active other than his routine of daily  living.  However, he has been quite stressed and has not induced these  symptoms.   PAST MEDICAL HISTORY:  1. Coronary artery disease (50% LAD stenosis.  He subsequently had a      myocardial infarction with an occlusion of his LAD and cardiogenic      shock.  He had an 80% diagonal treated with TAXUS stenting.).  2. Cardiomyopathy (EF of approximately 40%).  3. Previous cerebrovascular accident.  4. Dyslipidemia.  5. Right knee surgery.  6. Tonsillectomy.  7. Bilateral hernia repair.  8. Obsessive compulsive disorder.  9. Anxiety.   ALLERGIES:  Erythromycin and penicillin.   MEDICATIONS:  1.  Aspirin 81 mg daily.  2. Omega.  3. Coumadin.  4. Lisinopril 20 mg daily.  5. Niaspan 500 mg daily.  6. Simvastatin 20 mg daily.  7. Coenzyme Q.  8. Coreg 25 mg b.i.d.  9. Zoloft (I am not clear on the dose).   REVIEW OF SYSTEMS:  As stated in the HPI and otherwise negative for  other systems.   PHYSICAL EXAMINATION:  The patient is in no distress.  Blood pressure  124/80, heart rate 56 and regular, weight 180 pounds, body mass index  30.  HEENT:  Eyelids unremarkable, pupils equally round and reactive to  light, fundi not visualized, oral mucosa unremarkable.  NECK:  No jugular venous distention, wave form within normal limits,  carotid upstroke brisk and symmetrical, no bruits, no thyromegaly.  LYMPHATICS:  No cervical, axillary, inguinal adenopathy.  LUNGS:  Clear to auscultation bilaterally.  BACK:  No costovertebral angle tenderness.  CHEST:  Unremarkable.  HEART:  PMI not displaced or sustained, S1 and S2 within normal limits,  no S3, no S4, no clicks, no rubs, no murmurs.  ABDOMEN:  Obese, positive bowel sounds normal in frequency and pitch, no  bruits, no  rebound, no guarding or midline pulsatile mass, no  organomegaly.  SKIN:  No rashes, no nodules.  EXTREMITIES:  With 2+ pulses throughout, no edema, no cyanosis, no  clubbing.  NEURO:  Oriented to person, place, and time; cranial nerves II-XII  grossly intact, motor grossly intact.   ASSESSMENT/PLAN:  1. The patient is no longer having symptoms but does have the T wave      inversions on his echocardiogram as described above.  These were      not present in February of 2008, though they have been slightly      dynamic with some T wave inversions on October 2007.  Because of      this I do think he needs a stress test.  I think he could have a      stress nuclear study.  I do believe he would be able to walk on a      treadmill.  I will go ahead and arrange this as soon as it possible      with the patient's schedule as a Runner, broadcasting/film/video.  He will remain on the      medications as listed.  He has sublingual nitroglycerin and      understands to call 911 if he has any increase in symptoms.  2. Anxiety.  The patient has a significant problem with this and with      anger.  We discussed this at great length (greater than a half hour      with this).  He understands that he can not verbally abuse my      staff.  He is seeking counseling and has insight into his problems.      We will happy to continue to work with him on this.  I am      encouraged that he is taking the Zoloft.  3. Cardiomyopathy.  He is on target dose of his medications and will      continue with this.  He has Class I heart failure symptoms.  4. Risk reduction.  I will defer to Dr. Amador Cunas his lipids with a      goal HDL greater than 40 and an LDL less than 100.     Rollene Rotunda, MD, [facility]  Electronically Signed    JH/MedQ  DD: 02/21/2007  DT: 02/21/2007  Job #: [id number]   cc:   Gordy Savers, MD

## 2011-03-31 NOTE — Assessment & Plan Note (Signed)
[facility]                              CARDIOLOGY OFFICE NOTE   NAME:[name], [name]                       MRN:          [id number]  DATE:08/13/2006                            DOB:          12-24-1950    PRIMARY CARE PHYSICIAN:  [name] Savers, MD   REASON FOR PRESENTATION:  Evaluate patient with ischemic cardiomyopathy.   HISTORY OF PRESENT ILLNESS:  Patient returns for followup.  He is here for  med titration and to discuss whether he can come off his Coumadin.  He is  due to have a colonoscopy.  He did have a history of a CVA with PFO.  He was  subsequently put on Coumadin.  He has had no recent neurologic symptoms.  He  denies any blurred vision, speech or motor problems.  He has had no  palpitations, presyncope, or syncope.  I did increase his Coreg the last  time I saw him, and he seemed to tolerate this.  He has not had any PND or  orthopnea.  He has had no chest discomfort, neck discomfort, arm discomfort,  activity-induced nausea, vomiting, excessive diaphoresis.  He did get a  little fatigued with the increase in Coreg but overall tolerated this.   PAST MEDICAL HISTORY:  1. Coronary artery disease (50% LAD stenosis).  He subsequently had a      myocardial infarction, apparently with occlusion of that, and      cardiogenic shock.  He subsequently had 80% diagonal, treated with a      Taxus stent.  2. Cardiomyopathy (EF approximately 40%).  3. CVA.  4. Dyslipidemia.  5. Right knee surgery.  6. Tonsillectomy.  7. Bilateral hernia repair.   ALLERGIES:  ERYTHROMYCIN, PENICILLIN.   MEDICATIONS:  1. Aspirin 81 mg daily.  2. Vytorin 10/20 a day.  3. Niaspan 1000 mg a day.  4. Omega.  5. Coumadin.  6. Lisinopril 10 mg daily.  7. Coreg 12.5 mg b.i.d.   REVIEW OF SYSTEMS:  As stated in the HPI, otherwise negative for other  systems.   PHYSICAL EXAMINATION:  GENERAL:  Patient is in no distress.  VITAL SIGNS:  Blood pressure  110/70, heart rate 50 and regular.  HEENT:  Eyelids unremarkable.  Pupils are equal, round and reactive to  light.  Fundi not visualized.  Oral mucosa unremarkable.  NECK:  No jugular venous distention.  Wave form within normal limits.  Carotid upstroke brisk and symmetric.  No bruits, no thyromegaly.  LYMPHATICS:  No cervical, axillary, or inguinal adenopathy.  LUNGS:  Clear to auscultation bilaterally.  BACK:  No costovertebral angle tenderness.  CHEST:  Unremarkable.  HEART:  PMI not displaced or sustained.  S1 and S2 within normal limits.  No  S3, no S4, no murmurs.  ABDOMEN:  Flat, positive bowel sounds.  Normal in frequency and pitch.  No  bruits, rebound, guarding.  There are no midline pulsatile masses.  No  hepatomegaly, splenomegaly.  SKIN:  No rashes, no nodules.  EXTREMITIES:  Pulses 2+ throughout.  No edema.   EKG:  Sinus rhythm, rate  58, axis within normal limits, old anterior  myocardial infarction, nonspecific inferolateral T wave flattening.   ASSESSMENT/PLAN:  1. Anticoagulation:  We are asked to evaluate as to whether he can      discontinue his anticoagulation for his colonoscopy.  I think this is      safe from a cardiovascular standpoint.  He is not at the highest risk;      therefore, he will stop his Coumadin tonight and restart it after the      colonoscopy.  We will make sure that he gets this followed up a few      days after restarting it.  2. Cardiomyopathy:  Today I am going to try to increase his lisinopril to      20 mg a day.  He knows that if he gets lightheaded or presyncopal, to      let us know and to cut back 10 mg a day.  If he tolerates this, we will      check a BMET in 10 days.  We will leave the other medications as      listed.  3. Coronary disease:  He is having no ongoing symptoms.  No further      cardiovascular testing is suggested.  He will continue with aggressive      risk reduction.  4. Followup:  I would like to see him back in  about four weeks to see if      we make any other med titrations.            ______________________________  Rollene Rotunda, MD, [facility]     JH/MedQ  DD:  08/13/2006  DT:  08/14/2006  Job #:  [id number]   cc:   [name] Sa[place], MD

## 2011-03-31 NOTE — Assessment & Plan Note (Signed)
HEALTHCARE                            CARDIOLOGY OFFICE NOTE   NAME:Rothlisberger, Kohl                       MRN:          [id number]  DATE:12/19/2006                            DOB:          1951-01-30    PRIMARY CARE PHYSICIAN:  Dr. Amador Cunas.   REASON FOR PRESENTATION:  Evaluate patient with cardiomyopathy.   HISTORY OF PRESENT ILLNESS:  The patient is 60 years old.  He presents  for med titration for his cardiomyopathy.  At the last visit I increased  him to 18.75 mg b.i.d. of Coreg.  He had no problems with this.  He has  had no lightheadedness, presyncope or syncope.  He has had no shortness  of breath.  He denies any chest discomfort, neck discomfort, arm  discomfort, activity-induced nausea, vomiting or excessive diaphoresis.  His only complaint is insomnia.   PAST MEDICAL HISTORY:  1. Coronary artery disease (50% LAD stenosis.  He subsequently had a      myocardial infarction with occluding of that and cardiogenic shock.      He had an 80% diagonal treated with a Taxus stent.)  2. Cardiomyopathy (EF approximately 40%.)  3. CVA.  4. Dyslipidemia.  5. Right knee surgery.  6. Tonsillectomy.  7. Bilateral hernia repair.   ALLERGIES:  1. ERYTHROMYCIN.  2. PENICILLIN.   MEDICATIONS:  1. Aspirin 81 mg daily.  2. Vytorin 10/120 mg daily.  3. Omega III b.i.d.  4. Coumadin per his primary care doctor.  5. Lisinopril 20 mg daily.  6. Coenzyme Q.  7. Coreg 18.75 mg b.i.d.  8. Niaspan 500 mg daily.   REVIEW OF SYSTEMS:  As stated in the HPI, and otherwise negative for  other systems.   PHYSICAL EXAMINATION:  The patient is in no distress.  Blood pressure 121/79.  Heart rate is 72 and regular.  Weight 176  pounds.  HEENT:  Eyes unremarkable.  Pupils equal, round and reactive to light.  Fundi not visualized. Oral mucosa unremarkable.  NECK:  No jugular venous distention at 45 degrees.  Carotid upstroke  brisk and symmetric.  No bruits.   No thyromegaly.  LYMPHATICS:  No adenopathy.  LUNGS:  Clear to auscultation bilaterally.  BACK:  No costovertebral angle tenderness.  CHEST:  Unremarkable.  HEART:  PMI not displaced or sustained.  S1 and S2 within normal limits.  No S3.  No S4.  No clicks, rubs or murmurs.  ABDOMEN:  Mildly obese.  Positive bowel sounds.  Normal in frequency and  pitch.  No bruits.  No rebound.  No guarding.  No midline pulsatile  mass.  No hepatomegaly.  No splenomegaly.  SKIN:  No rashes.  No nodules.  EXTREMITIES:  Two plus pulses throughout.  No edema.  No cyanosis or  clubbing.  NEUROLOGIC:  Oriented to person, place and time.  Cranial nerves 2  through 12 grossly intact. Motor grossly intact.   EKG:  Sinus rhythm, rate 73, premature ventricular contractions, low  voltage in the limb leads, poor anterior R wave progression consistent  with an old anterior myocardial infarction, no acute  ST  T wave changes.   ASSESSMENT AND PLAN:  1. Cardiomyopathy.  Today, I will increase his Coreg to 25 mg b.i.d.      This will probably be the extent of his medication titration.  2. Dyslipidemia.  He did report his lipid profile to me.  He had an      LDL in the 40s.  His HDL is 40 as well.  Because of this, I have      written him to have a prescription of Simvastatin 20 mg.  I do not      think he needs the Vytorin at this point.  He will continue with      his Niaspan, and can have up-titration pending future HDL      measurements.  I did not see the report, and do not know the liver      enzymes, but will defer to his primary care doctor who drew this.  3. Followup.  I will see the patient back in about 3 months, sooner if      he has any problems.     Rollene Rotunda, MD, [facility]  Electronically Signed    JH/MedQ  DD: 12/19/2006  DT: 12/19/2006  Job #: [id number]

## 2011-03-31 NOTE — Assessment & Plan Note (Signed)
[facility]                              [facility] OFFICE NOTE   NAME:[name] [name]                       MRN:          [id number]  DATE:07/06/2006                            DOB:          02/09/1951    HISTORY OF PRESENT ILLNESS:  The patient is a 60 year old white gentleman  who is seen today to establish with our practice.  He has a history of  coronary artery disease with initial onset in June 2000 at which time he  presented with an anterior wall myocardial infarction.  This was treated  with TPA, and apparently post-procedure was complicated by re-infarction  cardiogenic shock requiring intra-aortic balloon counter pulsation.  Prior  to his discharge, cardiac catheterization revealed ejection fraction of 30%.  In November 2005 had the onset of chest pain and apparently had a  nonischemic nuclear stress test.  However, heart catheterization was  performed revealed an ejection fraction of 35% and a significant lesion  involving the diagonal branch.  This was treated with a Taxus stent and the  patient has done well.  In June 2006 the patient had a cerebrovascular event  that was thought to be cardioembolic.  At that time he presented with  weakness, visual disturbances, and short-term memory loss.  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  A TEE was  performed at that time that did reveal a small PFO with a positive bubble  study.  However, it was felt that cardiac source from LV dysfunction was  more likely.   PAST MEDICAL HISTORY:  1. Right knee arthroscopic surgery.  2. Remote tonsillectomy in 1958.  3. Bilateral hernia repair.   ALLERGIES:  1. ERYTHROMYCIN.  2. PENICILLIN.   SOCIAL HISTORY:  He is a nonsmoker, social drinker.   MEDICAL REGIMEN:  1. Aspirin 81 mg daily.  2. Coumadin 2 mg Mondays and Fridays, and 3 mg five days of the  week.  3. Coreg 6.25 mg b.i.d.  4. Lisinopril 5 mg daily.  5. Vytorin 10/20 daily.  6. Niaspan 500 mg, 2 daily.  7. Omega-3 fatty acids b.i.d.   FAMILY HISTORY:  Father is 76 with a history of atrial fibrillation.  Mother  deceased at 62 from complications of a large stroke.  She had coronary  artery disease and was status post CABG.  One half-sister has MS.   EXAM:  GENERAL:  Well-developed, mildly overweight male.  VITAL SIGNS:  Blood pressure 116/72.  SKIN:  Negative.  FUNDI, EARS, NOSE, AND THROAT:  Clear.  NECK:  No bruits.  CHEST:  Clear.  CARDIOVASCULAR EXAM:  Revealed a quiet precordium without murmurs or  gallops.  ABDOMEN:  Benign.  EXTREMITIES:  There is no peripheral edema.  Peripheral pulses were intact.  NEURO EXAM:  Grossly normal.   IMPRESSION:  1. Coronary artery disease status post anterior myocardial infarction in      June 2000.  2. Status post Taxus stent to the diagonal in November 2005.  3. Status  post cardioembolic stroke in June 2006.  4. Left ventricular dysfunction with an estimated ejection fraction of      approximately 35%.   DISPOSITION:  Prothrombin time will be checked today and adjusted as needed.  The patient has not had any colonoscopy and father does have a history of  colonic polyps.  This will be set up in the near term.  Additionally a  cardiology consult will be obtained.  The patient will be considered for a  stress test toward the end of the year.  The patient does complain of some  fatigue which he relates to Coreg.  Will change the dose to 20 mg daily dose  of the extended release.  Otherwise, the medical regimen will be unchanged.  Will reassess in three months but will set up colonoscopy and cardiac  evaluation.                                   Gordy Savers, MD   PFK/MedQ  DD:  07/06/2006  DT:  07/07/2006  Job #:  [id number]

## 2011-03-31 NOTE — Consult Note (Signed)
[name] [name], [name] [name] NO.:  [id number]   MEDICAL RECORD NO.:  [id number]          PATIENT TYPE:  INP   LOCATION:  A211                          FACILITY:  [facility]   PHYSICIAN:  Gerrit Friends. Dietrich Pates, MD, FACCDATE OF BIRTH:  [birth year]   DATE OF CONSULTATION:  DATE OF DISCHARGE:                                 CONSULTATION   CARDIOLOGY CONSULTATION   REFERRING PHYSICIAN:  Dr. Rito Ehrlich.   PRIMARY CARE PHYSICIAN:  Dr. Erby Pian.   PRIMARY CARDIOLOGIST:  Dr. Antoine Poche.   HISTORY OF PRESENT ILLNESS:  A 60 year old gentleman with known ischemic  cardiomyopathy presents with recurrent chest discomfort. [name] [name]  cardiac history dates back to 2000 when he suffered an anterior  myocardial infarction complicated by cardiogenic shock. He was treated  with thrombolysis with a good outcome. He subsequently required coronary  angiography in 2005 for recurrent chest discomfort at which time he had  a diagonal lesion in which a stent was placed. He has intermittent chest  discomfort that is different and less intense than the symptoms he had  with his myocardial infarction. Yesterday he was experiencing increased  stress at work and home. He noted intermittent tightening of his chest  and increased sense of anxiety. He had no real dyspnea nor diaphoresis.  There was no nausea. He took one sublingual nitroglycerin with  questionable benefit and presented to the emergency department. The  discomfort was of moderate intensity kind of located in the left chest  and radiated to the left arm. Symptoms were intermittent with  exacerbations lasting a number of minutes. He did not find anything that  either caused or relieved his symptoms.   In the emergency department, his electrocardiogram showed previous  anteroseptal MI without definite acute abnormalities. His cardiac  markers have been negative. He had one recurrent episode last night in  hospital for which he took sublingual  nitroglycerin. This morning he  feels better.   ALLERGIES:  AMOXICILLIN, ERYTHROMYCIN.   CURRENT MEDICATIONS:  1. Warfarin 5 mg daily.  2. Carvedilol 25 mg b.i.d.  3. Lisinopril 20 mg daily.  4. Aspirin 81 mg daily.   PAST MEDICAL HISTORY:  Notable for CVA a few years ago. Evaluation  revealed a patent foramen ovale, but it sounds as if the presumed  etiology was embolism from the left ventricle. He has been treated with  warfarin with somewhat variable anticoagulation. He has had hypertension  that has been well-controlled with medical therapy. There is no history  of hyperlipidemia nor diabetes. He has never used tobacco products.   SOCIAL HISTORY:  Married and works in [employer] system; no  history of excessive alcohol use.   FAMILY HISTORY:  No premature coronary artery disease.   REVIEW OF SYSTEMS:  Notable for anxiety and insomnia for which he is  currently seeing a local psychologist. He has undergone hernia repair in  the past. All other systems reviewed and are negative.   PHYSICAL EXAMINATION:  GENERAL:  Anxious-appearing, soft-spoken  gentleman in no acute distress.  VITAL SIGNS:  Blood pressure is 100/55, heart rate 60 and regular,  respirations 18, temperature 98.1.  HEENT:  Pupils equal, round, react to light; EOMs full; slight  proptosis, more prominent on the right.  NECK:  No jugular venous distension; normal carotid upstrokes without  bruits.  ENDOCRINE:  No thyromegaly.  HEMATOPOIETIC:  No adenopathy.  LUNGS:  Clear.  CARDIAC:  Normal first and second heart sounds.  ABDOMEN:  Soft and nontender; no organomegaly.  EXTREMITIES:  Normal distal pulses; no edema.  NEUROMUSCULAR:  Symmetric strength and tone; normal cranial nerves.  SKIN:  No significant lesions.  PSYCHIATRIC:  Alert and oriented; affect as noted above.   EKG:  Normal sinus rhythm; prior anteroseptal myocardial infarction;  modest T wave inversion in leads V4-V5 with flat T waves in  V6 and one  in aVL.   LABORATORY DATA:  Notable for a normal CBC, normal chemistry profile,  therapeutic INR 2.3, negative cardiac markers. BNP level 33.   IMPRESSION:  [name] [name] presents with somewhat worrisome chest  discomfort but a benign exam and cardiac markers and no acute changes on  his EKG. He appears to be experiencing significant stress related to a  new job and issues in his personal life. He is inclined to attribute his  current symptoms to anxiety. His medical therapy is quite good except  for the absence of a lipid lowering agent. I will leave a decision on  that modification to Dr. Antoine Poche. Based upon his current symptoms, he  might benefit from p.r.n. benzodiazepines. I think it appropriate to  discharge him from inpatient care and plan a stress echocardiogram on  Monday.   We greatly appreciate the opportunity to assist in [facility]  hospital care and will arrange stress testing and subsequent follow-up  with Dr. Antoine Poche.      Gerrit Friends. Dietrich Pates, MD, [facility]  Electronically Signed     RMR/MEDQ  D:  02/01/2007  T:  02/01/2007  Job:  [id number]

## 2011-03-31 NOTE — Assessment & Plan Note (Signed)
[facility]                              CARDIOLOGY OFFICE NOTE   NAME:[name] [name], [name] [name]                       MRN:          [id number]  DATE:09/25/2006                            DOB:          Apr 21, 1951    PRIMARY CARE PHYSICIAN:  Gordy Savers, M.D.   REASON FOR PRESENTATION:  Evaluate patient with cardiomyopathy.   HISTORY OF PRESENT ILLNESS:  The patient presents for medication titration.  He brought his wife today to ask questions about our process.  He has been  doing well.  When I last saw him, I increased his Lisinopril from 10 mg to  20 mg once a day.  He has been taking 10 mg b.i.d.  He also doubled his  Coreg at that time.  He actually did well with this, and has not had any  lightheadedness, pre-syncope or syncope.  He has had no chest pain or  shortness of breath.   PAST MEDICAL/SURGICAL HISTORY:  1. Coronary artery disease (50% LAD stenosis).  He subsequently had a      myocardial infarction with occlusion of that and cardiogenic shock.  He      subsequently had an 80% diagonal, treated with a Taxus stent.  2. Cardiomyopathy (ejection fraction approximately 40%).  3. CVA.  4. Dyslipidemia.  5. Right knee surgery.  6. Tonsillectomy.  7. Bilateral hernia repair.   ALLERGIES:  ERYTHROMYCIN AND PENICILLIN.   MEDICATIONS:  1. Aspirin 81 mg q.d.  2. Vytorin 10/20 mg q.d.  3. Niaspan 1000 mg q.d.  4. Omega.  5. Coumadin.  6. Coreg 12.5 mg b.i.d.  7. Lisinopril 20 mg q.d.  8. __________.   REVIEW OF SYSTEMS:  As stated in the HPI and otherwise negative for other  systems.   PHYSICAL EXAMINATION:  GENERAL:  The patient is in no distress.  VITAL SIGNS:  Blood pressure 117/76, heart rate 60 and regular.  Weight 182  pounds.  Body index is 30.  HEENT:  Eyes unremarkable.  Pupils equal, round, reactive to light.  Fundi  not visualized. Oral mucosa unremarkable.  NECK:  No jugular venous distention.  Wave form within normal  limits.  Carotid upstroke brisk and symmetrical, no bruits.  No thyromegaly.  NODES:  No adenopathy.  LUNGS:  Clear to auscultation bilaterally.  CHEST:  Unremarkable.  HEART:  PMI not displaced or sustained.  S1 and S2, within normal limits.  No S3, no S4, no murmurs.  ABDOMEN:  Obese, with positive bowel sounds.  Normal in frequency and pitch.  No bruits, rebound, no guarding.  No midline pulsatile mass.  No  organomegaly.  SKIN:  No rash.  EXTREMITIES:  With 2+ pulses, no edema.   ASSESSMENT/PLAN:  1. Cardiomyopathy today:  I am going to increase his Coreg to 18.75 mg      b.i.d.  The goal will be 25 mg b.i.d.  He and I discussed this at      length.  He will continue on the medications as listed.  2. Coronary artery disease:  No further cardiovascular testing was  suggested.  He will continue with risk reduction.   FOLLOWUP:  I will see him back in about six weeks for the next medication  titration.     Rollene Rotunda, MD, [facility]  Electronically Signed    JH/MedQ  DD: 09/25/2006  DT: 09/25/2006  Job #: [id number]   cc:   Gordy Savers, MD

## 2011-03-31 NOTE — Assessment & Plan Note (Signed)
[facility]                                 ON-CALL NOTE   NAME:[name], [name]                       MRN:          [id number]  DATE:02/17/2007                            DOB:          [birth year]    PRIMARY CARDIOLOGIST:  Dr. Antoine Poche.   I received a phone call from [name] complaining that he wanted to  be seen sooner in the office than previously scheduled appointment.  The  patient was very belligerent and requested that he be seen first thing  Tuesday morning for an echocardiogram that was scheduled on Wednesday at  3 p.m., that would be April 9th at 3 p.m.  He states the did not want to  wait and will be arriving at the office on Tuesday morning in the a.m.  to have his echocardiogram, whether it is scheduled or not.  He states  that he has been having a lot of difficulties with our office and  complaint of being followed up and he has called to let me know that he  is insistent on having his echocardiogram on Tuesday morning instead of  Wednesday as a previously scheduled appointment.  He also states that he  wished to have a followup appointment and no one had given him one.  I  have given him instructions that I would be calling our office and  leaving a message to have them call him first thing Monday morning to  discuss rescheduling the echo and followup appointment.  He states that  he will be at the office Tuesday whether anyone calls him or not.  This  has been noted and the office has been notified.     Bettey Mare. Lyman Bishop, NP  Electronically Signed    KML/MedQ  DD: 02/17/2007  DT: 02/17/2007  Job #: [id number]

## 2011-05-30 ENCOUNTER — Encounter: Payer: Self-pay | Admitting: Gastroenterology

## 2011-06-14 ENCOUNTER — Ambulatory Visit (HOSPITAL_COMMUNITY): Payer: Self-pay

## 2011-06-19 ENCOUNTER — Encounter (HOSPITAL_COMMUNITY): Payer: Medicare Other | Attending: Oncology

## 2011-06-19 ENCOUNTER — Encounter (HOSPITAL_COMMUNITY): Payer: Self-pay

## 2011-06-19 VITALS — BP 107/70 | HR 65 | Temp 97.9°F | Ht 66.0 in | Wt 204.4 lb

## 2011-06-19 DIAGNOSIS — D696 Thrombocytopenia, unspecified: Secondary | ICD-10-CM | POA: Insufficient documentation

## 2011-06-19 NOTE — Progress Notes (Signed)
Note dictated

## 2011-06-19 NOTE — Patient Instructions (Signed)
[facility]  Discharge Instructions   SPECIAL INSTRUCTIONS/FOLLOW-UP: Lab work Needed and Return to Clinic in one month.  See the front desk for your appointments.   I acknowledge that I have been informed and understand all the instructions given to me and received a copy. I do not have any more questions at this time, but understand that I may call the Specialty Clinic at [facility] at [phone] during business hours should I have any further questions or need assistance in obtaining follow-up care.    __________________________________________  _____________  __________ Signature of Patient or Authorized Representative            Date                   Time    __________________________________________ Nurse's Signature

## 2011-06-20 NOTE — Progress Notes (Signed)
CC:   [name] [name]. Sherwood Gambler, MD Nicki Guadalajara, M.D.  PROBLEM:  Thrombocytopenia.  This a delightful 60 year old gentleman from [place], [place], here today for evaluation of thrombocytopenia.  HISTORY OF PRESENT ILLNESS:  This gentleman has been in good health.  He has been seen by Dr. Sherwood Gambler who performed some routine labs which initially showed a platelet count 106.  This has recently been repeated, it now at 122.  The rest of his counts appear to be normal with a white count 4.5 and hematocrit of 45, reticulocyte count 2.5%.  review of his labs, lasts from 2008, showed a platelet count of 157.  He has no associated symptoms, i.e., bleeding, bruising.  He is on chronic Coumadin as well as aspirin for previous history of stroke and MI.  He is on no new medications.  He has not had any other recent surgery.  PAST MEDICAL HISTORY:  His past medical history is significant for a history of a MI in the year 2000 and a stroke in 2006.  At that time, he was living in [place].  He subsequently recovered from this and has been free of any problems related to chest pain.  He did have some depression and memory changes after a stroke which has improved.  He is currently on disability after having try to return to work and having ongoing issues with depression.  So has been on disability since about the past 5 years.  PAST SURGICAL HISTORY: 1. Hernia repair in 1985. 2. Perirectal abscess repair. 3. He has a history of tonsillectomy as a child. 4. Pin placement in his finger secondary to football injuries, as well     as shoulder surgery.  HABITS:  Nonsmoker, non-alcohol consumer.  ALLERGIES:  He is allergic to either amoxicillin or erythromycin.  FAMILY HISTORY:  Parents both deceased; father from pneumonia, mother from complications of stroke.  He has a half-sister who has had a MI.  SOCIAL HISTORY:  He has been married, has a 60 year old child.  He was originally from [place],  [place].  Moved to [place] as a child.  Went to [employer] and has an additional degree for GIS from [place].  He had moved to a regional area from [place] in 2007.  His wife is a Charity fundraiser working at a friend's home.  He, as noted, has a 75 year old.  REVIEW OF SYSTEMS:  He does have a history of low testosterone.  He is not on any replacement.  CURRENT MEDICATIONS:  Include:  Coumadin, vitamin D, trazodone, Zetia, Lovaza, lisinopril, Niaspan, Coreg, and aspirin.  PHYSICAL EXAMINATION:  General:  A pleasant gentleman looking his stated age.  Vital Signs:  Blood pressure of 107/70, pulse 365, respiratory rate 16, and temperature 97.9.  Head and Neck exam:  He is anicteric. Pupils are reactive.  Extraocular movements are normal.  He has no peripheral adenopathy.  Trachea midline.  No thyromegaly.  Lungs are normal.  Heart:  Sounds are normal.  Abdomen:  He has no palpable hepatosplenomegaly.  No adenopathy.  Extremities:  No peripheral cyanosis, clubbing, or edema.  Neurologic exam:  Grossly intact.  LABORATORY DATA:  This gentleman has no current labs.  His last labs show a platelet 122.  Prior to that it was 106 and then prior, about 8 years ago it was 153.  ASSESSMENT AND PLAN:  I suspect he has low-grade thrombocytopenia which may represent his "normal."  My plan is to have him come back in about a month's time  to have a seen by Dr. Mariel Sleet.  At that time, we will repeat a CBC with a manual platelet count and smear.  We will also check an ANA, and serum protein electrophoresis and immunofixation.  I will also check an ultrasound his platelets to rule out occult splenomegaly. He may also benefit from a free testosterone level, as low testosterones may in fact do play rule in development of low-grade thrombocytopenia. At this point, he is not a risk of bleeding.  So I have cautioned about this and also discussed the issue of him being on both Coumadin and aspirin.  He knows to call  should he have concern.  I suspect this thrombocytopenia is a benign etiology.    ______________________________ Pierce Crane, M.D., F.R.C.P.C. PR/MEDQ  D:  06/19/2011  T:  06/19/2011  Job:  [id number]

## 2011-07-05 ENCOUNTER — Ambulatory Visit (INDEPENDENT_AMBULATORY_CARE_PROVIDER_SITE_OTHER): Payer: Medicare Other | Admitting: Gastroenterology

## 2011-07-05 ENCOUNTER — Encounter: Payer: Self-pay | Admitting: Gastroenterology

## 2011-07-05 VITALS — BP 110/64 | HR 60 | Ht 66.0 in | Wt 201.0 lb

## 2011-07-05 DIAGNOSIS — Z7901 Long term (current) use of anticoagulants: Secondary | ICD-10-CM

## 2011-07-05 DIAGNOSIS — K921 Melena: Secondary | ICD-10-CM

## 2011-07-05 DIAGNOSIS — Z86010 Personal history of colonic polyps: Secondary | ICD-10-CM

## 2011-07-05 NOTE — Progress Notes (Signed)
History of Present Illness: This is a 60 year old white male here today with his wife. He relates intermittent episodes of small amounts of bright red blood on the tissue paper when wiping for the past several months. He has constipation and states if he takes more fiber his bowels tend to move normally. He underwent colonoscopy in October 2007 showing 1 small adenomatous colon polyp and no other maladies. He has a father who has multiple polyps. Denies weight loss, abdominal pain, diarrhea, change in stool caliber, melena, nausea, vomiting, dysphagia, reflux symptoms, chest pain.  Past Medical History  Diagnosis Date  . Myocardial infarction 2000  . Stroke 2006  . Allergic rhinitis   . Congestive heart failure   . Depression   . Hyperlipidemia   . Hypertension   . CAD (coronary artery disease)   . Tubular adenoma polyp of rectum 08/2006  . Anxiety   . Perirectal abscess    Past Surgical History  Procedure Date  . Hernia repair   . Rectal surgery [id number]    perirectal abscess  . Coronary angioplasty with stent placement 2005    reports that he has never smoked. He has never used smokeless tobacco. He reports that he does not drink alcohol or use illicit drugs. family history includes ADD / ADHD in his son; Colitis in his father; Colon polyps in his father; Coronary artery disease in his father; Diabetes in his father; Heart failure in his brother; and Hypertension in his father. Allergies  Allergen Reactions  . Amoxicillin   . Erythromycin    Outpatient Encounter Prescriptions as of 07/05/2011  Medication Sig Dispense Refill  . aspirin 81 MG tablet Take 81 mg by mouth daily.        . carvedilol (COREG) 12.5 MG tablet Take 12.5 mg by mouth 2 (two) times daily with a meal. Takes only a half a tab at night       . Cholecalciferol (VITAMIN D3) 5000 UNITS CAPS Take by mouth daily.        . Coenzyme Q10 (CO Q-10) 100 MG CAPS Take by mouth daily. Two tabs       . ezetimibe (ZETIA) 10 MG  tablet Take 10 mg by mouth daily.        Marland Kitchen lisinopril (PRINIVIL,ZESTRIL) 20 MG tablet Take 20 mg by mouth daily.        Marland Kitchen NIASPAN 1000 MG CR tablet Takes 2000 mg once daily      . omega-3 acid ethyl esters (LOVAZA) 1 G capsule Take 2 g by mouth 2 (two) times daily.        . traZODone (DESYREL) 100 MG tablet Take 100 mg by mouth at bedtime.        Marland Kitchen warfarin (COUMADIN) 2.5 MG tablet Take 2.5 mg by mouth daily. 2.5mg  mon-fri, 3mg  Sat&Sun        Review of Systems: Pertinent positive and negative review of systems were noted in the above HPI section. All other review of systems were otherwise negative.  Physical Exam: General: Well developed , well nourished, no acute distress Head: Normocephalic and atraumatic Eyes:  sclerae anicteric, EOMI Ears: Normal auditory acuity Mouth: No deformity or lesions Neck: Supple, no masses or thyromegaly Lungs: Clear throughout to auscultation Heart: Regular rate and rhythm; no murmurs, rubs or bruits Abdomen: Soft, non tender and non distended. No masses, hepatosplenomegaly or hernias noted. Normal Bowel sounds Rectal: Deferred to colonoscopy Musculoskeletal: Symmetrical with no gross deformities  Skin: No lesions on visible extremities  Pulses:  Normal pulses noted Extremities: No clubbing, cyanosis, edema or deformities noted Neurological: Alert oriented x 4, grossly nonfocal Cervical Nodes:  No significant cervical adenopathy Inguinal Nodes: No significant inguinal adenopathy Psychological:  Alert and cooperative. Normal mood and affect  Assessment and Recommendations:  1. Small volume hematochezia, constipation and prior history of an adenomatous colon polyp. I suspect his small volume hematochezia is related to hemorrhoids. He is advised to substantially increase his daily fiber and fluid intake and take a fiber supplement on a regular basis. Schedule colonoscopy. The risks, benefits, and alternatives to colonoscopy with possible biopsy and possible  polypectomy were discussed with the patient and they consent to proceed.   2. Chronic warfarin anticoagulation with a history of prior CVA and patent foramen ovale. He has underlying coronary artery disease and cardiomyopathy. The risks, benefits and alternatives to a three-day hold of warfarin were discussed with the patient and he consents to proceed. Obtain clearance from his primary physician.

## 2011-07-05 NOTE — Patient Instructions (Addendum)
You have been scheduled for a Colonoscopy.  See separate instructions. Suprep sample kit given.  You will be contaced by our office prior to your procedure for directions on holding your Coumadin/Warfarin.  If you do not hear from our office 1 week prior to your scheduled procedure, please call [phone] to discuss.   cc:  Elfredia Nevins, MD

## 2011-07-06 ENCOUNTER — Encounter: Payer: Self-pay | Admitting: Gastroenterology

## 2011-07-13 ENCOUNTER — Telehealth: Payer: Self-pay

## 2011-07-13 NOTE — Telephone Encounter (Signed)
Informed patient that we received the fax back regarding his coumadin clearance. Per Dr. Tresa Endo pt can come off coumadin 4 days prior to his procedure. Pt agreed and verbalized understanding.

## 2011-07-24 ENCOUNTER — Ambulatory Visit (HOSPITAL_COMMUNITY): Admission: RE | Admit: 2011-07-24 | Payer: Medicare Other | Source: Ambulatory Visit

## 2011-07-27 ENCOUNTER — Encounter (HOSPITAL_COMMUNITY): Payer: Medicare Other | Attending: Oncology

## 2011-07-27 DIAGNOSIS — D696 Thrombocytopenia, unspecified: Secondary | ICD-10-CM

## 2011-07-27 LAB — CBC
HCT: 44 % (ref 39.0–52.0)
Hemoglobin: 15.4 g/dL (ref 13.0–17.0)
MCH: 31.8 pg (ref 26.0–34.0)
MCHC: 35 g/dL (ref 30.0–36.0)
MCV: 90.7 fL (ref 78.0–100.0)
Platelets: 107 K/uL — ABNORMAL LOW (ref 150–400)
RBC: 4.85 MIL/uL (ref 4.22–5.81)
RDW: 12.2 % (ref 11.5–15.5)
WBC: 6.2 K/uL (ref 4.0–10.5)

## 2011-07-27 LAB — DIFFERENTIAL
Basophils Absolute: 0 K/uL (ref 0.0–0.1)
Basophils Relative: 0 % (ref 0–1)
Eosinophils Absolute: 0.1 K/uL (ref 0.0–0.7)
Eosinophils Relative: 2 % (ref 0–5)
Lymphocytes Relative: 52 % — ABNORMAL HIGH (ref 12–46)
Lymphs Abs: 3.3 K/uL (ref 0.7–4.0)
Monocytes Absolute: 0.4 K/uL (ref 0.1–1.0)
Monocytes Relative: 7 % (ref 3–12)
Neutro Abs: 2.4 K/uL (ref 1.7–7.7)
Neutrophils Relative %: 39 % — ABNORMAL LOW (ref 43–77)

## 2011-07-27 NOTE — Progress Notes (Signed)
Labs drawn today for protein elect,immunofixatin,ana,cbc/diff

## 2011-07-31 ENCOUNTER — Other Ambulatory Visit (HOSPITAL_COMMUNITY): Payer: MEDICARE

## 2011-07-31 LAB — PROTEIN ELECTROPHORESIS, SERUM
Albumin ELP: 63.7 % (ref 55.8–66.1)
Alpha-1-Globulin: 3.1 % (ref 2.9–4.9)
Total Protein ELP: 6.5 g/dL (ref 6.0–8.3)

## 2011-07-31 LAB — IMMUNOFIXATION ELECTROPHORESIS
IgA: 200 mg/dL (ref 68–379)
IgM, Serum: 26 mg/dL — ABNORMAL LOW (ref 41–251)

## 2011-08-01 ENCOUNTER — Ambulatory Visit (HOSPITAL_COMMUNITY)
Admission: RE | Admit: 2011-08-01 | Discharge: 2011-08-01 | Disposition: A | Discharge status: Home / Self Care | Payer: Medicare Other | Source: Ambulatory Visit | Attending: Oncology | Admitting: Oncology

## 2011-08-01 ENCOUNTER — Encounter (HOSPITAL_BASED_OUTPATIENT_CLINIC_OR_DEPARTMENT_OTHER): Payer: Medicare Other | Admitting: Oncology

## 2011-08-01 VITALS — BP 111/71 | HR 57 | Temp 98.1°F | Wt 202.4 lb

## 2011-08-01 DIAGNOSIS — K769 Liver disease, unspecified: Secondary | ICD-10-CM | POA: Insufficient documentation

## 2011-08-01 DIAGNOSIS — D696 Thrombocytopenia, unspecified: Secondary | ICD-10-CM

## 2011-08-01 DIAGNOSIS — K7689 Other specified diseases of liver: Secondary | ICD-10-CM

## 2011-08-01 NOTE — Patient Instructions (Signed)
[facility]  Discharge Instructions  RECOMMENDATIONS MADE BY THE CONSULTANT AND ANY TEST RESULTS WILL BE SENT TO YOUR REFERRING DOCTOR.   SPECIAL INSTRUCTIONS/FOLLOW-UP: Lab work Needed  and Return to Clinic in 3 months.  See the front desk for appointments.   I acknowledge that I have been informed and understand all the instructions given to me and received a copy. I do not have any more questions at this time, but understand that I may call the Specialty Clinic at [facility] at [phone] during business hours should I have any further questions or need assistance in obtaining follow-up care.    __________________________________________  _____________  __________ Signature of Patient or Authorized Representative            Date                   Time    __________________________________________ Nurse's Signature

## 2011-08-01 NOTE — Progress Notes (Signed)
This office note has been dictated.

## 2011-08-02 ENCOUNTER — Encounter: Payer: Self-pay | Admitting: Gastroenterology

## 2011-08-02 ENCOUNTER — Ambulatory Visit (AMBULATORY_SURGERY_CENTER): Payer: Medicare Other | Admitting: Gastroenterology

## 2011-08-02 DIAGNOSIS — D126 Benign neoplasm of colon, unspecified: Secondary | ICD-10-CM

## 2011-08-02 DIAGNOSIS — K921 Melena: Secondary | ICD-10-CM

## 2011-08-02 DIAGNOSIS — Z86010 Personal history of colonic polyps: Secondary | ICD-10-CM

## 2011-08-02 MED ORDER — SODIUM CHLORIDE 0.9 % IV SOLN: 500.0000 mL | INTRAVENOUS | Status: DC

## 2011-08-02 NOTE — Progress Notes (Signed)
CC:   Madelin Rear. Fusco, MD  DIAGNOSES: 1. Mild thrombocytopenia, possibly related to liver disease, though he     does not have obvious splenomegaly presently. 2. Hernia repair in 1985. 3. Tonsillectomy. 4. Perirectal abscess repair in the past. 5. Depression consistent with bipolar disorder on therapy with     trazodone. 6. Hypercholesterolemia on Niaspan and Zetia. 7. Hypertension. 8. History of heart attack complicated by congestive heart failure at     the time.  HISTORY:  Sahid saw Dr. Pierce Crane on August 6 for mild but definite thrombocytopenia.  His platelets at that time were 107,000.  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  No M-spike was detected.  His ANA was negative. His white count was normal, hemoglobin was normal, MCV was normal.  Dr. Donnie Coffin mentioned doing an ultrasound of the abdomen which unfortunately did not get done, so when we saw him today we were able to get it done today with the help of Radiology.  His spleen was 7.5 cm in length which is normal in size, but his liver showed significant hepatic steatosis.  No focal lesions were seen.  This gentleman therefore has mild thrombocytopenia which needs to be observed at this time only.  I told him that with hepatic steatosis it would be great if he could lose weight and try to bring that way down to ideal weight in the 150-160 pound range.  That might help his liver going forward and I do not know if it would help his platelets, but I do not think it could hurt him.  It might prevent further damage to his liver, however.  We therefore decided we will meet again in a few months with a repeat CBC.  It is of note that his wife who is a nurse accompanied him today as well and was in on the entire conversation.  I did not examine him otherwise.  It is of note that he has already had liver enzymes checked, all of which are normal.  That was back in  July.  It is of note that his testosterone level has also been checked by Dr. Sherwood Gambler and is mildly low at 234 ng/dL.  His platelets back on May 18, 2011 were 106,000.  If his platelets continue to be low we may check a B12 and folic acid level, though he really is not a drinker, has not had stomach surgery, et..  He states he may have a cocktail once every 1-2 months potentially.  We will therefore see him back in followup.  He does not need any intervention at this juncture.    ______________________________ Ladona Horns. Mariel Sleet, MD ESN/MEDQ  D:  08/01/2011  T:  08/02/2011  Job:  [id number]

## 2011-08-02 NOTE — Patient Instructions (Signed)
[facility] discharge sheet reviewed with patient and care partner.  Impressions/recommendations:  Polyp (handout given) Diverticulosis (handout given) High Fiber Diet handout given Hemorrhoids (handout given)  No aspirin or antiinflammatory for 2 weeks, may take after October 3rd.  Resume coumadin today and check PT/INR within one week.  May resume other medications as you were taking them prior to your procedure.

## 2011-08-02 NOTE — Progress Notes (Signed)
BP DECREASED TO 78/51. SKIN WARM AND DRY 02 SAT 96 %, PULSE STRONG TO PALPATE. MD AWARE. IVF WIDE OPEN. NO FURTHER ORDERS. EWM

## 2011-08-03 ENCOUNTER — Telehealth: Payer: Self-pay | Admitting: *Deleted

## 2011-08-03 NOTE — Telephone Encounter (Signed)
Message left to call if necessary. 

## 2011-08-07 ENCOUNTER — Encounter: Payer: Self-pay | Admitting: Gastroenterology

## 2011-08-22 LAB — PROTIME-INR
INR: 2.3 — ABNORMAL HIGH
INR: 3 — ABNORMAL HIGH
Prothrombin Time: 26 — ABNORMAL HIGH
Prothrombin Time: 30.9 — ABNORMAL HIGH
Prothrombin Time: 32.3 — ABNORMAL HIGH

## 2011-08-22 LAB — TSH: TSH: 2.162

## 2011-08-22 LAB — T3, FREE: T3, Free: 3.2 (ref 2.3–4.2)

## 2011-08-23 LAB — URINALYSIS, ROUTINE W REFLEX MICROSCOPIC
Glucose, UA: NEGATIVE
Ketones, ur: NEGATIVE
Nitrite: NEGATIVE
pH: 6

## 2011-08-23 LAB — DIFFERENTIAL
Basophils Absolute: 0
Lymphocytes Relative: 30
Lymphs Abs: 1.9
Neutro Abs: 3.9

## 2011-08-23 LAB — BASIC METABOLIC PANEL
BUN: 14
Calcium: 9.4
GFR calc non Af Amer: >60
Glucose, Bld: 98
Potassium: 4.2
Sodium: 137

## 2011-08-23 LAB — CBC
Hemoglobin: 14.5
Platelets: 187
RDW: 12
WBC: 6.6

## 2011-08-23 LAB — RAPID URINE DRUG SCREEN, HOSP PERFORMED
Cocaine: NOT DETECTED
Tetrahydrocannabinol: NOT DETECTED

## 2011-08-23 LAB — ETHANOL: Alcohol, Ethyl (B): <5

## 2011-08-24 LAB — ETHANOL: Alcohol, Ethyl (B): <5

## 2011-08-24 LAB — BASIC METABOLIC PANEL
Chloride: 107
GFR calc non Af Amer: >60
Potassium: 4
Sodium: 137

## 2011-08-24 LAB — URINALYSIS, ROUTINE W REFLEX MICROSCOPIC
Glucose, UA: NEGATIVE
Leukocytes, UA: NEGATIVE
Protein, ur: NEGATIVE
Specific Gravity, Urine: 1.02

## 2011-08-24 LAB — URINE MICROSCOPIC-ADD ON

## 2011-08-24 LAB — CBC
HCT: 44.5
Hemoglobin: 15.1
MCV: 89.3
RBC: 4.99
WBC: 8.2

## 2011-08-24 LAB — DIFFERENTIAL
Eosinophils Relative: 1
Lymphocytes Relative: 24
Lymphs Abs: 1.9
Monocytes Absolute: 0.6
Monocytes Relative: 7

## 2011-08-24 LAB — PROTIME-INR: Prothrombin Time: 33.6 — ABNORMAL HIGH

## 2011-08-24 LAB — RAPID URINE DRUG SCREEN, HOSP PERFORMED
Amphetamines: NOT DETECTED
Benzodiazepines: NOT DETECTED

## 2011-08-24 LAB — T4: T4, Total: 7.2

## 2011-10-31 ENCOUNTER — Encounter (HOSPITAL_COMMUNITY): Payer: Medicare Other | Attending: Oncology

## 2011-10-31 DIAGNOSIS — D696 Thrombocytopenia, unspecified: Secondary | ICD-10-CM | POA: Insufficient documentation

## 2011-10-31 LAB — CBC
HCT: 41.6 % (ref 39.0–52.0)
Platelets: 114 10*3/uL — ABNORMAL LOW (ref 150–400)
RDW: 12.4 % (ref 11.5–15.5)
WBC: 3.9 10*3/uL — ABNORMAL LOW (ref 4.0–10.5)

## 2011-10-31 LAB — DIFFERENTIAL
Basophils Relative: 0 % (ref 0–1)
Eosinophils Absolute: 0.1 10*3/uL (ref 0.0–0.7)
Eosinophils Relative: 1 % (ref 0–5)
Lymphocytes Relative: 48 % — ABNORMAL HIGH (ref 12–46)
Monocytes Absolute: 0.4 10*3/uL (ref 0.1–1.0)
Neutro Abs: 1.6 10*3/uL — ABNORMAL LOW (ref 1.7–7.7)

## 2011-10-31 LAB — COMPREHENSIVE METABOLIC PANEL
Albumin: 4 g/dL (ref 3.5–5.2)
Alkaline Phosphatase: 53 U/L (ref 39–117)
BUN: 12 mg/dL (ref 6–23)
Calcium: 9.4 mg/dL (ref 8.4–10.5)
Creatinine, Ser: 0.84 mg/dL (ref 0.50–1.35)
GFR calc Af Amer: >90 mL/min (ref >90)
Glucose, Bld: 97 mg/dL (ref 70–99)
Potassium: 4.1 mEq/L (ref 3.5–5.1)
Total Protein: 7 g/dL (ref 6.0–8.3)

## 2011-10-31 NOTE — Progress Notes (Signed)
Labs drawn today for cbc/diff,cmp 

## 2011-11-01 ENCOUNTER — Encounter (HOSPITAL_COMMUNITY): Payer: Medicare Other

## 2011-11-01 ENCOUNTER — Encounter (HOSPITAL_BASED_OUTPATIENT_CLINIC_OR_DEPARTMENT_OTHER): Payer: Medicare Other | Admitting: Oncology

## 2011-11-01 VITALS — BP 117/72 | HR 72 | Temp 98.0°F | Wt 174.8 lb

## 2011-11-01 DIAGNOSIS — D696 Thrombocytopenia, unspecified: Secondary | ICD-10-CM

## 2011-11-01 NOTE — Progress Notes (Signed)
This office note has been dictated.

## 2011-11-01 NOTE — Patient Instructions (Signed)
HARTFORD MAULDEN  [id number] [birth year]   [facility]  Discharge Instructions  RECOMMENDATIONS MADE BY THE CONSULTANT AND ANY TEST RESULTS WILL BE SENT TO YOUR REFERRING DOCTOR.   EXAM FINDINGS BY MD TODAY AND SIGNS AND SYMPTOMS TO REPORT TO CLINIC OR PRIMARY MD: You are doing well with your weight loss.  Continue the exercise and diet that you are on. There is no need to perform an ultrasound.  MEDICATIONS PRESCRIBED: none   INSTRUCTIONS GIVEN AND DISCUSSED: Other :  Report unusually bleeding or bruising.  SPECIAL INSTRUCTIONS/FOLLOW-UP: Return to Clinic:  See Schedule   I acknowledge that I have been informed and understand all the instructions given to me and received a copy. I do not have any more questions at this time, but understand that I may call the Specialty Clinic at [facility] at [phone] during business hours should I have any further questions or need assistance in obtaining follow-up care.    __________________________________________  _____________  __________ Signature of Patient or Authorized Representative            Date                   Time    __________________________________________ Nurse's Signature

## 2011-11-01 NOTE — Progress Notes (Signed)
CC:   Madelin Rear. Fusco, MD  DIAGNOSES: 1. Mild thrombocytopenia which is stable. 2. History of heart disease and sees one of the cardiologists for his     followup. 3. History of ischemic cardiomyopathy. 4. History of myocardial infarction in the past. 5. History of congestive heart failure in the past. 6. History of hyperlipidemia, on therapy. 7. History of patent foramen ovale. 8. History of hyponatremia in the past. 9. History of leukopenia intermittently. 10.History of depression. This gentleman definitely had an ultrasound of his abdomen that showed fatty infiltration of his liver, but no splenomegaly.  When I saw him in September he weighed 202 pounds.  He is now down to 174 pounds.  He is on a fast of some sort for 2 weeks at a time and this is the second time he has done it.  He is trying to adjust his diet.  I have talked to him about trying to adjust his diet to something that he can live with for a long time.  He is going to try to aim for his ideal weight which is right around 152-156 pounds in my opinion at his age.  Interestingly, his ultrasound did not show splenomegaly.  It did show the fatty infiltration of the liver.  It also showed no obvious adenopathy.  His platelets have remained in the same range, namely 114,000 the other day versus 107,000 in September.  White count was slightly low at 3900.  White count 6200 back in September.  Hemoglobin is still perfectly normal.  The rest of the workup was unremarkable.  I therefore think we just need to watch him.  I do not think we need to be too concerned about him.  He wanted to know why we could not do another ultrasound to see if his fatty liver went away and I told him that I really did not think it was indicated at this juncture nor would it be potentially in the future unless things change for the worse.  I have encouraged him to keep his weight off, keep a good diet going and see [place] back in 6 months for a  followup CBC.    ______________________________ Ladona Horns. Mariel Sleet, MD ESN/MEDQ  D:  11/01/2011  T:  11/01/2011  Job:  [id number]

## 2011-11-25 ENCOUNTER — Encounter (HOSPITAL_COMMUNITY): Payer: Self-pay | Admitting: Emergency Medicine

## 2011-11-25 ENCOUNTER — Other Ambulatory Visit: Payer: Self-pay

## 2011-11-25 ENCOUNTER — Emergency Department (HOSPITAL_COMMUNITY): Payer: Medicare Other

## 2011-11-25 ENCOUNTER — Observation Stay (HOSPITAL_COMMUNITY)
Admission: EM | Admit: 2011-11-25 | Discharge: 2011-11-26 | Disposition: A | Discharge status: Home / Self Care | Payer: Medicare Other | Attending: Internal Medicine | Admitting: Internal Medicine

## 2011-11-25 DIAGNOSIS — R748 Abnormal levels of other serum enzymes: Secondary | ICD-10-CM

## 2011-11-25 DIAGNOSIS — Y9361 Activity, american tackle football: Secondary | ICD-10-CM | POA: Insufficient documentation

## 2011-11-25 DIAGNOSIS — E871 Hypo-osmolality and hyponatremia: Secondary | ICD-10-CM

## 2011-11-25 DIAGNOSIS — F410 Panic disorder [episodic paroxysmal anxiety] without agoraphobia: Secondary | ICD-10-CM

## 2011-11-25 DIAGNOSIS — F4321 Adjustment disorder with depressed mood: Secondary | ICD-10-CM

## 2011-11-25 DIAGNOSIS — G4700 Insomnia, unspecified: Secondary | ICD-10-CM

## 2011-11-25 DIAGNOSIS — I252 Old myocardial infarction: Secondary | ICD-10-CM

## 2011-11-25 DIAGNOSIS — K7689 Other specified diseases of liver: Secondary | ICD-10-CM | POA: Insufficient documentation

## 2011-11-25 DIAGNOSIS — R079 Chest pain, unspecified: Secondary | ICD-10-CM | POA: Insufficient documentation

## 2011-11-25 DIAGNOSIS — F411 Generalized anxiety disorder: Secondary | ICD-10-CM | POA: Insufficient documentation

## 2011-11-25 DIAGNOSIS — Z7901 Long term (current) use of anticoagulants: Secondary | ICD-10-CM

## 2011-11-25 DIAGNOSIS — G5600 Carpal tunnel syndrome, unspecified upper limb: Secondary | ICD-10-CM

## 2011-11-25 DIAGNOSIS — Z7982 Long term (current) use of aspirin: Secondary | ICD-10-CM | POA: Insufficient documentation

## 2011-11-25 DIAGNOSIS — J309 Allergic rhinitis, unspecified: Secondary | ICD-10-CM

## 2011-11-25 DIAGNOSIS — F3289 Other specified depressive episodes: Secondary | ICD-10-CM

## 2011-11-25 DIAGNOSIS — Z860100 Personal history of colon polyps, unspecified: Secondary | ICD-10-CM

## 2011-11-25 DIAGNOSIS — Z8673 Personal history of transient ischemic attack (TIA), and cerebral infarction without residual deficits: Secondary | ICD-10-CM | POA: Insufficient documentation

## 2011-11-25 DIAGNOSIS — R0789 Other chest pain: Secondary | ICD-10-CM | POA: Diagnosis present

## 2011-11-25 DIAGNOSIS — F319 Bipolar disorder, unspecified: Secondary | ICD-10-CM

## 2011-11-25 DIAGNOSIS — W219XXA Striking against or struck by unspecified sports equipment, initial encounter: Secondary | ICD-10-CM | POA: Insufficient documentation

## 2011-11-25 DIAGNOSIS — I2589 Other forms of chronic ischemic heart disease: Secondary | ICD-10-CM

## 2011-11-25 DIAGNOSIS — Y998 Other external cause status: Secondary | ICD-10-CM | POA: Insufficient documentation

## 2011-11-25 DIAGNOSIS — Y92009 Unspecified place in unspecified non-institutional (private) residence as the place of occurrence of the external cause: Secondary | ICD-10-CM | POA: Insufficient documentation

## 2011-11-25 DIAGNOSIS — E785 Hyperlipidemia, unspecified: Secondary | ICD-10-CM

## 2011-11-25 DIAGNOSIS — Q211 Atrial septal defect: Secondary | ICD-10-CM

## 2011-11-25 DIAGNOSIS — I6350 Cerebral infarction due to unspecified occlusion or stenosis of unspecified cerebral artery: Secondary | ICD-10-CM

## 2011-11-25 DIAGNOSIS — S8010XA Contusion of unspecified lower leg, initial encounter: Secondary | ICD-10-CM | POA: Insufficient documentation

## 2011-11-25 DIAGNOSIS — M542 Cervicalgia: Principal | ICD-10-CM

## 2011-11-25 DIAGNOSIS — I5022 Chronic systolic (congestive) heart failure: Secondary | ICD-10-CM

## 2011-11-25 DIAGNOSIS — Q2111 Secundum atrial septal defect: Secondary | ICD-10-CM

## 2011-11-25 DIAGNOSIS — I2510 Atherosclerotic heart disease of native coronary artery without angina pectoris: Secondary | ICD-10-CM

## 2011-11-25 DIAGNOSIS — F329 Major depressive disorder, single episode, unspecified: Secondary | ICD-10-CM

## 2011-11-25 DIAGNOSIS — I509 Heart failure, unspecified: Secondary | ICD-10-CM

## 2011-11-25 DIAGNOSIS — Z79899 Other long term (current) drug therapy: Secondary | ICD-10-CM | POA: Insufficient documentation

## 2011-11-25 DIAGNOSIS — S40019A Contusion of unspecified shoulder, initial encounter: Secondary | ICD-10-CM | POA: Insufficient documentation

## 2011-11-25 DIAGNOSIS — D696 Thrombocytopenia, unspecified: Secondary | ICD-10-CM

## 2011-11-25 DIAGNOSIS — I10 Essential (primary) hypertension: Secondary | ICD-10-CM

## 2011-11-25 LAB — BASIC METABOLIC PANEL
BUN: 14 mg/dL (ref 6–23)
CO2: 26 mEq/L (ref 19–32)
Calcium: 9.5 mg/dL (ref 8.4–10.5)
Chloride: 101 mEq/L (ref 96–112)
Creatinine, Ser: 0.66 mg/dL (ref 0.50–1.35)
GFR calc Af Amer: >90 mL/min (ref >90)
GFR calc non Af Amer: >90 mL/min (ref >90)
Glucose, Bld: 112 mg/dL — ABNORMAL HIGH (ref 70–99)
Potassium: 3.6 mEq/L (ref 3.5–5.1)
Sodium: 133 mEq/L — ABNORMAL LOW (ref 135–145)

## 2011-11-25 LAB — MRSA PCR SCREENING: MRSA by PCR: NEGATIVE

## 2011-11-25 LAB — POCT I-STAT TROPONIN I: Troponin i, poc: 0 ng/mL (ref 0.00–0.08)

## 2011-11-25 LAB — CBC
HCT: 37.1 % — ABNORMAL LOW (ref 39.0–52.0)
Hemoglobin: 13.1 g/dL (ref 13.0–17.0)
MCH: 31.3 pg (ref 26.0–34.0)
MCHC: 35.3 g/dL (ref 30.0–36.0)
MCV: 88.8 fL (ref 78.0–100.0)
Platelets: 124 10*3/uL — ABNORMAL LOW (ref 150–400)
RBC: 4.18 MIL/uL — ABNORMAL LOW (ref 4.22–5.81)
RDW: 12.1 % (ref 11.5–15.5)
WBC: 4.4 10*3/uL (ref 4.0–10.5)

## 2011-11-25 LAB — PROTIME-INR
INR: 2.07 — ABNORMAL HIGH (ref 0.00–1.49)
Prothrombin Time: 23.7 seconds — ABNORMAL HIGH (ref 11.6–15.2)

## 2011-11-25 LAB — TROPONIN I: Troponin I: <0.3 ng/mL (ref <0.30)

## 2011-11-25 MED ORDER — ACETAMINOPHEN 650 MG RE SUPP: 650.0000 mg | Freq: Four times a day (QID) | RECTAL | Status: DC | PRN

## 2011-11-25 MED ORDER — ASPIRIN 81 MG PO CHEW
324.0000 mg | CHEWABLE_TABLET | Freq: Once | ORAL | Status: AC
  Administered 2011-11-25: 324 mg via ORAL
  Filled 2011-11-25: qty 4

## 2011-11-25 MED ORDER — ASPIRIN EC 81 MG PO TBEC: 81.0000 mg | DELAYED_RELEASE_TABLET | Freq: Every day | ORAL | Status: DC

## 2011-11-25 MED ORDER — ACETAMINOPHEN 325 MG PO TABS: 650.0000 mg | ORAL_TABLET | Freq: Four times a day (QID) | ORAL | Status: DC | PRN

## 2011-11-25 MED ORDER — OMEGA-3-ACID ETHYL ESTERS 1 G PO CAPS
2.0000 g | ORAL_CAPSULE | Freq: Two times a day (BID) | ORAL | Status: DC
  Administered 2011-11-25: 2 g via ORAL
  Filled 2011-11-25: qty 2

## 2011-11-25 MED ORDER — WARFARIN SODIUM 2 MG PO TABS
4.0000 mg | ORAL_TABLET | Freq: Every day | ORAL | Status: DC
  Administered 2011-11-25: 4 mg via ORAL
  Filled 2011-11-25: qty 2

## 2011-11-25 MED ORDER — TRAZODONE HCL 50 MG PO TABS
100.0000 mg | ORAL_TABLET | Freq: Every day | ORAL | Status: DC
  Administered 2011-11-25: 100 mg via ORAL
  Filled 2011-11-25: qty 2

## 2011-11-25 MED ORDER — NITROGLYCERIN 0.4 MG SL SUBL: 0.4000 mg | SUBLINGUAL_TABLET | SUBLINGUAL | Status: DC | PRN

## 2011-11-25 MED ORDER — LISINOPRIL 10 MG PO TABS: 20.0000 mg | ORAL_TABLET | Freq: Every day | ORAL | Status: DC

## 2011-11-25 MED ORDER — ONDANSETRON HCL 4 MG PO TABS: 4.0000 mg | ORAL_TABLET | Freq: Four times a day (QID) | ORAL | Status: DC | PRN

## 2011-11-25 MED ORDER — ONDANSETRON HCL 4 MG/2ML IJ SOLN: 4.0000 mg | Freq: Four times a day (QID) | INTRAMUSCULAR | Status: DC | PRN

## 2011-11-25 MED ORDER — EZETIMIBE 10 MG PO TABS: 10.0000 mg | ORAL_TABLET | Freq: Every day | ORAL | Status: DC

## 2011-11-25 MED ORDER — NIACIN ER (ANTIHYPERLIPIDEMIC) 500 MG PO TBCR
1000.0000 mg | EXTENDED_RELEASE_TABLET | Freq: Every day | ORAL | Status: DC
  Filled 2011-11-25 (×2): qty 2

## 2011-11-25 MED ORDER — CARVEDILOL 12.5 MG PO TABS
12.5000 mg | ORAL_TABLET | Freq: Two times a day (BID) | ORAL | Status: DC
  Administered 2011-11-26: 12.5 mg via ORAL
  Filled 2011-11-25 (×2): qty 1

## 2011-11-25 NOTE — ED Notes (Signed)
Pt continues to be pain free, pt and family at bedside updated on plan of care

## 2011-11-25 NOTE — ED Notes (Addendum)
Patient c/o left neck pain/tightness that radiates into left chest. Denies any shortness of breath, dizziness, nausea, or vomiting. Patient reports extensive cardiac hx. Patient non diaphoretic. Patient reports taking x1 nitro SL with relief.

## 2011-11-25 NOTE — ED Notes (Signed)
MD at bedside. 

## 2011-11-25 NOTE — H&P (Signed)
PCP:   Cassell Smiles., MD, MD   Cardiologist:  Dr. Tresa Endo with [facility]  Chief Complaint:  Left neck tightness relieved by nitroglycerin  HPI: [name] [name] is an 61 y.o. Caucasian male.   Coronary artery disease status post MI and stenting; she cardiomyopathy with ejection fraction of 30-35% by echo 2007; on chronic Coumadin anticoagulation for ventricular scarring with aneurysm, clot and embolic stroke via patent foreman ovale.  History of depression, developed severe left-sided neck tightness while in heated argument with his son. Patient says he thought this was the usual chest pain he sometimes gets when he is anxious, but it was much more severe than usual and alarmed him. He took a sublingual nitroglycerin and it relieved the pain  Patient apparently told the emergency room physician that he had chest pain radiating to his left shoulder, but now denies that history. In any event patient is very concerned about this pain he says it's very unusual, and would feel safer getting it checked out because of his concern about his heart disease.  Denies dizziness syncope or nausea vomiting or diaphoresis.  Rewiew of Systems:  The patient denies anorexia, fever, weight loss,, vision loss, decreased hearing, hoarseness, chest pain, syncope, dyspnea on exertion, peripheral edema, balance deficits, hemoptysis, abdominal pain, melena, hematochezia, severe indigestion/heartburn, hematuria, incontinence, genital sores, muscle weakness, suspicious skin lesions, transient blindness, difficulty walking,  unusual weight change, abnormal bleeding, enlarged lymph nodes, angioedema, and breast masses.    Past Medical History  Diagnosis Date  . Myocardial infarction 2000  . Stroke 2006  . Allergic rhinitis   . Congestive heart failure   . Depression   . Hyperlipidemia   . Hypertension   . CAD (coronary artery disease)   . Tubular adenoma polyp of rectum 08/2006  . Anxiety     . Perirectal abscess   . Fatty liver     Past Surgical History  Procedure Date  . Hernia repair   . Rectal surgery [id number]    perirectal abscess  . Coronary angioplasty with stent placement 2005    Medications:  HOME MEDS: Prior to Admission medications   Medication Sig Start Date End Date Taking? Authorizing Provider  aspirin EC 81 MG tablet Take 81 mg by mouth daily.   Yes Historical Provider, MD  carvedilol (COREG) 12.5 MG tablet Take 12.5 mg by mouth 2 (two) times daily with a meal.    Yes Historical Provider, MD  Coenzyme Q10 (CO Q-10) 100 MG CAPS Take 1 tablet by mouth 2 (two) times daily.    Yes Historical Provider, MD  ezetimibe (ZETIA) 10 MG tablet Take 10 mg by mouth daily.     Yes Historical Provider, MD  lisinopril (PRINIVIL,ZESTRIL) 20 MG tablet Take 20 mg by mouth daily.     Yes Historical Provider, MD  NIASPAN 1000 MG CR tablet Take 1,000 mg by mouth daily.  05/19/11  Yes Historical Provider, MD  omega-3 acid ethyl esters (LOVAZA) 1 G capsule Take 2 g by mouth 2 (two) times daily.     Yes Historical Provider, MD  traZODone (DESYREL) 100 MG tablet Take 100 mg by mouth at bedtime.     Yes Historical Provider, MD  warfarin (COUMADIN) 4 MG tablet Take 4 mg by mouth daily. Patient takes the brand Coumadin and is taking 4mg  for 2 weeks   Yes Historical Provider, MD     Allergies:  Allergies  Allergen Reactions  . Amoxicillin   . Erythromycin  Social History:   reports that he has never smoked. He has never used smokeless tobacco. He reports that he does not drink alcohol or use illicit drugs.  Family History: Family History  Problem Relation Age of Onset  . Coronary artery disease Father   . Hypertension Father   . Colon polyps Father   . Colitis Father   . Diabetes Father   . ADD / ADHD Son   . Heart failure Mother      Physical Exam: Filed Vitals:   11/25/11 1427 11/25/11 1600 11/25/11 1700 11/25/11 1800  BP: 123/75 107/61 146/81 123/73  Pulse:  58 51 60 56  Temp: 98 F (36.7 C)     TempSrc: Oral     Resp: 16 13 13 21   Height: 5\' 6"  (1.676 m)     Weight: 74.844 kg (165 lb)     SpO2: 97% 98% 100% 98%   Blood pressure 123/73, pulse 56, temperature 98 F (36.7 C), temperature source Oral, resp. rate 21, height 5\' 6"  (1.676 m), weight 74.844 kg (165 lb), SpO2 98.00%.  GEN:  Pleasant middle-aged Caucasian gentleman sitting up in the stretcher in no acute distress; cooperative with exam. PSYCH:  alert and oriented x4; does not appear anxious does not appear depressed; affect is appropriate  HEENT: Mucous membranes pink and anicteric; PERRLA; EOM intact; no cervical lymphadenopathy nor thyromegaly or carotid bruit; no JVD; Breasts:: Not examined CHEST WALL: No tenderness CHEST: Normal respiration, clear to auscultation bilaterally HEART: Regular rate and rhythm; no murmurs rubs or gallops BACK: No kyphosis or scoliosis; no CVA tenderness ABDOMEN: Obese, soft non-tender; no masses, no organomegaly, normal abdominal bowel sounds; Rectal Exam: Not done EXTREMITIES: No bone or joint deformity; ; no edema; no ulcerations. Genitalia: not examined PULSES: 2+ and symmetric SKIN: Normal hydration no rash or ulceration CNS: Cranial nerves 2-12 grossly intact no focal neurologic deficit   Labs & Imaging Results for orders placed during the hospital encounter of 11/25/11 (from the past 48 hour(s))  CBC     Status: Abnormal   Collection Time   11/25/11  2:45 PM      Component Value Range Comment   WBC 4.4  4.0 - 10.5 (K/uL)    RBC 4.18 (*) 4.22 - 5.81 (MIL/uL)    Hemoglobin 13.1  13.0 - 17.0 (g/dL)    HCT 16.1 (*) 09.6 - 52.0 (%)    MCV 88.8  78.0 - 100.0 (fL)    MCH 31.3  26.0 - 34.0 (pg)    MCHC 35.3  30.0 - 36.0 (g/dL)    RDW 04.5  40.9 - 81.1 (%)    Platelets 124 (*) 150 - 400 (K/uL)   BASIC METABOLIC PANEL     Status: Abnormal   Collection Time   11/25/11  2:45 PM      Component Value Range Comment   Sodium 133 (*) 135 - 145  (mEq/L)    Potassium 3.6  3.5 - 5.1 (mEq/L)    Chloride 101  96 - 112 (mEq/L)    CO2 26  19 - 32 (mEq/L)    Glucose, Bld 112 (*) 70 - 99 (mg/dL)    BUN 14  6 - 23 (mg/dL)    Creatinine, Ser 9.14  0.50 - 1.35 (mg/dL)    Calcium 9.5  8.4 - 10.5 (mg/dL)    GFR calc non Af Amer >90  >90 (mL/min)    GFR calc Af Amer >90  >90 (mL/min)   PROTIME-INR  Status: Abnormal   Collection Time   11/25/11  2:55 PM      Component Value Range Comment   Prothrombin Time 23.7 (*) 11.6 - 15.2 (seconds)    INR 2.07 (*) 0.00 - 1.49    POCT I-STAT TROPONIN I     Status: Normal   Collection Time   11/25/11  3:14 PM      Component Value Range Comment   Troponin i, poc 0.00  0.00 - 0.08 (ng/mL)    Comment 3            TROPONIN I     Status: Normal   Collection Time   11/25/11  4:38 PM      Component Value Range Comment   Troponin I <0.30  <0.30 (ng/mL)    Dg Chest 2 View  11/25/2011  *RADIOLOGY REPORT*  Clinical Data: Chest pain.  CHEST - 2 VIEW  Comparison: 01/31/2007  Findings: Cardiomediastinal silhouette is within normal limits. Coronary stent is visible.  The lungs are free of focal consolidations and pleural effusions. Visualized osseous structures have a normal appearance.  IMPRESSION: Negative exam.  Original Report Authenticated By: Patterson Hammersmith, M.D.    EKG shows no acute changes    Assessment Present on Admission:  . neck pain and tightness probably a manifestation of Anxiety attack .DEPRESSION .CARDIOMYOPATHY, ISCHEMIC .HYPERLIPIDEMIA .HYPERTENSION .HYPONATREMIA, MILD .Chronic systolic heart failure  PLAN:   although this gentleman is now comfortable and had a relatively familiar pain associated with a stressful situation, because of his cardiac risk factors, prompt relief with nitroglycerin, we'll observe this patient for cardiac rule out, and if negative he can follow up with his cardiologist as an outpatient.   Other plans as per orders.    Lavan Imes 11/25/2011,  8:08 PM

## 2011-11-25 NOTE — ED Notes (Signed)
Pt given warm blanket.

## 2011-11-25 NOTE — ED Notes (Signed)
Pt c/o tension, type pain to left neck area that radiates to left chest area, pain started while pt was having "discussion" with his son, pt states that the pain is gone now, Dr. Juleen China in prior to RN, see EDP assessment for further

## 2011-11-25 NOTE — ED Notes (Signed)
Pt ambulatory to restroom, denies any complaints

## 2011-11-25 NOTE — ED Provider Notes (Signed)
History  Scribed for Raeford Razor, MD, the patient was seen in [facility]. The chart was scribed by Gilman Schmidt. The patients care was started at 2:37 PM.   CSN: [id number]  Arrival date & time 11/25/11  1418   First MD Initiated Contact with Patient 11/25/11 1429      Chief Complaint  Patient presents with  . Chest Pain    (Consider location/radiation/quality/duration/timing/severity/associated sxs/prior treatment) HPI [name] [name] is a 61 y.o. male who presents to the Emergency Department complaining of chest pain onset one hour ago. Pt reports having an in depth conversation with son that resulted in tightness in chest. Radiates to L neck and L shoulder. History of MI (2000) and CVA (2006) but states symptoms are different. Pt states has not had interventions since 2006. Pt states he is having minor sensations currently and feels as if his strength is okay States he took nitro with relief. Pt is on Coumadin. Pt takes 81mg  of ASA. Denies any headache, numbness, SOB, or tingling. Pt states he was feeling "okay" prior to today. Pt exercises regularly. There are no other associated symptoms and no other alleviating or aggravating factors.   Cardiologist: Dr. Nicki Guadalajara PCP: Dr. Earlene Plater    Past Medical History  Diagnosis Date  . Myocardial infarction 2000  . Stroke 2006  . Allergic rhinitis   . Congestive heart failure   . Depression   . Hyperlipidemia   . Hypertension   . CAD (coronary artery disease)   . Tubular adenoma polyp of rectum 08/2006  . Anxiety   . Perirectal abscess   . Fatty liver     Past Surgical History  Procedure Date  . Hernia repair   . Rectal surgery [id number]    perirectal abscess  . Coronary angioplasty with stent placement 2005    Family History  Problem Relation Age of Onset  . Coronary artery disease Father   . Hypertension Father   . Colon polyps Father   . Colitis Father   . Diabetes Father   . ADD / ADHD Son   . Heart failure  Mother     History  Substance Use Topics  . Smoking status: Never Smoker   . Smokeless tobacco: Never Used  . Alcohol Use: No      Review of Systems  Respiratory: Negative for shortness of breath.   Cardiovascular: Positive for chest pain.  Neurological: Negative for weakness, numbness and headaches.  All other systems reviewed and are negative.    Allergies  Amoxicillin and Erythromycin  Home Medications   Current Outpatient Rx  Name Route Sig Dispense Refill  . ASPIRIN 81 MG PO TABS Oral Take 81 mg by mouth daily.      Marland Kitchen CARVEDILOL 12.5 MG PO TABS Oral Take 12.5 mg by mouth 2 (two) times daily with a meal.     . CO Q-10 100 MG PO CAPS Oral Take by mouth daily. Two tabs     . EZETIMIBE 10 MG PO TABS Oral Take 10 mg by mouth daily.      Marland Kitchen LISINOPRIL 20 MG PO TABS Oral Take 20 mg by mouth daily.      Marland Kitchen NIASPAN 1000 MG PO TBCR  Takes 2000 mg once daily    . OMEGA-3-ACID ETHYL ESTERS 1 G PO CAPS Oral Take 2 g by mouth 2 (two) times daily.      . TRAZODONE HCL 100 MG PO TABS Oral Take 100 mg by mouth at bedtime.      Marland Kitchen  WARFARIN SODIUM 2.5 MG PO TABS Oral Take 4 mg by mouth daily.       BP 123/75  Pulse 58  Temp(Src) 98 F (36.7 C) (Oral)  Resp 16  Ht 5\' 6"  (1.676 m)  Wt 165 lb (74.844 kg)  BMI 26.63 kg/m2  SpO2 97%  Physical Exam  Constitutional: He is oriented to person, place, and time. He appears well-developed and well-nourished.  Non-toxic appearance. He does not have a sickly appearance.  HENT:  Head: Normocephalic and atraumatic.  Eyes: Conjunctivae, EOM and lids are normal. Pupils are equal, round, and reactive to light.  Neck: Trachea normal, normal range of motion and full passive range of motion without pain. Neck supple.  Cardiovascular: Regular rhythm and normal heart sounds.  Exam reveals no gallop and no friction rub.   No murmur heard. Pulmonary/Chest: Effort normal and breath sounds normal. No respiratory distress.  Abdominal: Soft. Normal  appearance. He exhibits no distension. There is no tenderness. There is no rebound and no CVA tenderness.  Musculoskeletal: Normal range of motion.  Neurological: He is alert and oriented to person, place, and time. He has normal strength.  Skin: Skin is warm, dry and intact. No rash noted.    ED Course  Procedures (including critical care time)  Labs Reviewed  CBC - Abnormal; Notable for the following:    RBC 4.18 (*)    HCT 37.1 (*)    Platelets 124 (*)    All other components within normal limits  BASIC METABOLIC PANEL - Abnormal; Notable for the following:    Sodium 133 (*)    Glucose, Bld 112 (*)    All other components within normal limits  PROTIME-INR - Abnormal; Notable for the following:    Prothrombin Time 23.7 (*)    INR 2.07 (*)    All other components within normal limits  TROPONIN I  POCT I-STAT TROPONIN I  I-STAT TROPONIN I   Dg Chest 2 View  11/25/2011  *RADIOLOGY REPORT*  Clinical Data: Chest pain.  CHEST - 2 VIEW  Comparison: 01/31/2007  Findings: Cardiomediastinal silhouette is within normal limits. Coronary stent is visible.  The lungs are free of focal consolidations and pleural effusions. Visualized osseous structures have a normal appearance.  IMPRESSION: Negative exam.  Original Report Authenticated By: Patterson Hammersmith, M.D.     1. Chest pain   2. Neck pain     EKG:  Rhythm: normal sinus Rate: 70 Axis: normal Intervals: normal ST segments: NS ST changes. t wave inversions laterally which were seen on previous. Biphasic t waves anteriorly resolved.    DIAGNOSTIC STUDIES: Oxygen Saturation is 97% on room air, normal by my interpretation.    COORDINATION OF CARE: 2:37pm:  - Patient evaluated by ED physician, ASA, DG Chest, CBC, BMP, i-stat ordered      MDM  60yM with CP with radiation to L neck and shoulder.  NS ST changes on EKG with no appreciable change. Trop neg. Given hx of known CAD will admit for r/o.  I personally preformed the  services scribed in my presence. The recorded information has been reviewed and considered. Raeford Razor, MD.       Raeford Razor, MD 11/25/11 [phone]

## 2011-11-25 NOTE — ED Notes (Signed)
Pt up to the bathroom no complaints at this time.

## 2011-11-25 NOTE — Progress Notes (Signed)
Notified MD of pt's home med schedule; orders given to hold lisinopril and give coreg if pt requests

## 2011-11-26 DIAGNOSIS — R0789 Other chest pain: Secondary | ICD-10-CM | POA: Diagnosis present

## 2011-11-26 DIAGNOSIS — R748 Abnormal levels of other serum enzymes: Secondary | ICD-10-CM | POA: Diagnosis present

## 2011-11-26 LAB — CBC
HCT: 39.7 % (ref 39.0–52.0)
Hemoglobin: 13.9 g/dL (ref 13.0–17.0)
MCHC: 35 g/dL (ref 30.0–36.0)
MCV: 90 fL (ref 78.0–100.0)
RDW: 12.3 % (ref 11.5–15.5)

## 2011-11-26 LAB — HEPATIC FUNCTION PANEL
AST: 30 U/L (ref 0–37)
Albumin: 3.8 g/dL (ref 3.5–5.2)
Alkaline Phosphatase: 63 U/L (ref 39–117)
Bilirubin, Direct: 0.1 mg/dL (ref 0.0–0.3)
Total Bilirubin: 0.4 mg/dL (ref 0.3–1.2)

## 2011-11-26 LAB — CARDIAC PANEL(CRET KIN+CKTOT+MB+TROPI)
CK, MB: 3.2 ng/mL (ref 0.3–4.0)
Relative Index: 0.4 (ref 0.0–2.5)
Troponin I: <0.3 ng/mL (ref <0.30)

## 2011-11-26 LAB — URINALYSIS, ROUTINE W REFLEX MICROSCOPIC
Ketones, ur: NEGATIVE mg/dL
Nitrite: NEGATIVE

## 2011-11-26 LAB — BASIC METABOLIC PANEL
BUN: 13 mg/dL (ref 6–23)
CO2: 26 mEq/L (ref 19–32)
Chloride: 105 mEq/L (ref 96–112)
Creatinine, Ser: 0.75 mg/dL (ref 0.50–1.35)
GFR calc Af Amer: >90 mL/min (ref >90)
Glucose, Bld: 163 mg/dL — ABNORMAL HIGH (ref 70–99)
Potassium: 3.6 mEq/L (ref 3.5–5.1)

## 2011-11-26 LAB — MAGNESIUM: Magnesium: 2.1 mg/dL (ref 1.5–2.5)

## 2011-11-26 LAB — PROTIME-INR: Prothrombin Time: 22.9 seconds — ABNORMAL HIGH (ref 11.6–15.2)

## 2011-11-26 MED ORDER — NITROGLYCERIN 0.4 MG SL SUBL: 0.4000 mg | SUBLINGUAL_TABLET | SUBLINGUAL | Status: AC | PRN

## 2011-11-26 NOTE — Discharge Summary (Signed)
DISCHARGE SUMMARY  [name] [name]  MR#: [id number]  DOB:1951/10/26  Date of Admission: 11/25/2011 Date of Discharge: 11/26/2011  Attending Physician: Standley Dakins  Patient's PCP: Cassell Smiles., MD, MD Cardiologist: Dr. Tresa Endo [facility]  Discharge Diagnoses: Present on Admission:  .DEPRESSION .CARDIOMYOPATHY, ISCHEMIC .HYPERLIPIDEMIA .HYPERTENSION .HYPONATREMIA, MILD .Chronic systolic heart failure .Anxiety attack .Neck pain .Elevated CK .Atypical chest pain  Medication List  As of 11/26/2011  9:40 AM   CONTINUE taking these medications         aspirin EC 81 MG tablet      carvedilol 12.5 MG tablet   Commonly known as: COREG      Co Q-10 100 MG Caps      ezetimibe 10 MG tablet   Commonly known as: ZETIA      lisinopril 20 MG tablet   Commonly known as: PRINIVIL,ZESTRIL      LOVAZA 1 G capsule   Generic drug: omega-3 acid ethyl esters      NIASPAN 1000 MG CR tablet   Generic drug: niacin      traZODone 100 MG tablet   Commonly known as: DESYREL      warfarin 4 MG tablet   Commonly known as: COUMADIN              Hospital Course: Present on Admission:  .DEPRESSION .CARDIOMYOPATHY, ISCHEMIC .HYPERLIPIDEMIA .HYPERTENSION .HYPONATREMIA, MILD .Chronic systolic heart failure .Anxiety attack .Neck pain .Elevated CK .Atypical chest pain:  The patient came to the ED reporting that he had a strange sensation radiating into the left jaw area after he had been in a family argument with a son and became upset yesterday.  He was concerned with his history of CAD and came in for evaluation.  He was ruled out for acute myocardial injury with 3 sets of negative troponin tests.  He was found to have an elevated CK level and after further questioning of the patient he has admitted that he had been playing tackle football with his sons and has bruising on his right shoulder and left leg.  He was advised to drink extra clear fluids for  the next several days and to follow up with his primary care physician in 3-7 days to have his CK levels rechecked.  The patient verbalized understanding.     Day of Discharge Patient asking to go home. He denies any chest pain and has no further neck pain or sensation in his neck. He tells me today that he has been playing hard tackle football with his sons at home and that is the reason that he is bruised up over his body.  He said that he has not fallen down and has not been assaulted.    BP 120/70  Pulse 74  Temp(Src) 97.9 F (36.6 C) (Oral)  Resp 12  Ht 5\' 6"  (1.676 m)  Wt 76.4 kg (168 lb 6.9 oz)  BMI 27.19 kg/m2  SpO2 99%  Physical Exam: Gen: awake, alert, NAD Neck: supple, no JVD, no carotid bruits CV:  Normal S1, s2 sounds ABD: soft, +BS, nd/nt EXT: no clubbing, cyanosis or edema, multiple bruises on right shoulder, left leg Neuro: awake, alert, oriented  Results for orders placed during the hospital encounter of 11/25/11 (from the past 24 hour(s))  CBC     Status: Abnormal   Collection Time   11/25/11  2:45 PM      Component Value Range   WBC 4.4  4.0 - 10.5 (K/uL)  RBC 4.18 (*) 4.22 - 5.81 (MIL/uL)   Hemoglobin 13.1  13.0 - 17.0 (g/dL)   HCT 16.1 (*) 09.6 - 52.0 (%)   MCV 88.8  78.0 - 100.0 (fL)   MCH 31.3  26.0 - 34.0 (pg)   MCHC 35.3  30.0 - 36.0 (g/dL)   RDW 04.5  40.9 - 81.1 (%)   Platelets 124 (*) 150 - 400 (K/uL)  BASIC METABOLIC PANEL     Status: Abnormal   Collection Time   11/25/11  2:45 PM      Component Value Range   Sodium 133 (*) 135 - 145 (mEq/L)   Potassium 3.6  3.5 - 5.1 (mEq/L)   Chloride 101  96 - 112 (mEq/L)   CO2 26  19 - 32 (mEq/L)   Glucose, Bld 112 (*) 70 - 99 (mg/dL)   BUN 14  6 - 23 (mg/dL)   Creatinine, Ser 9.14  0.50 - 1.35 (mg/dL)   Calcium 9.5  8.4 - 78.2 (mg/dL)   GFR calc non Af Amer >90  >90 (mL/min)   GFR calc Af Amer >90  >90 (mL/min)  PROTIME-INR     Status: Abnormal   Collection Time   11/25/11  2:55 PM      Component  Value Range   Prothrombin Time 23.7 (*) 11.6 - 15.2 (seconds)   INR 2.07 (*) 0.00 - 1.49   POCT I-STAT TROPONIN I     Status: Normal   Collection Time   11/25/11  3:14 PM      Component Value Range   Troponin i, poc 0.00  0.00 - 0.08 (ng/mL)   Comment 3           TROPONIN I     Status: Normal   Collection Time   11/25/11  4:38 PM      Component Value Range   Troponin I <0.30  <0.30 (ng/mL)  MRSA PCR SCREENING     Status: Normal   Collection Time   11/25/11  9:15 PM      Component Value Range   MRSA by PCR NEGATIVE  NEGATIVE   CARDIAC PANEL(CRET KIN+CKTOT+MB+TROPI)     Status: Abnormal   Collection Time   11/26/11 12:08 AM      Component Value Range   Total CK 631 (*) 7 - 232 (U/L)   CK, MB 3.7  0.3 - 4.0 (ng/mL)   Troponin I <0.30  <0.30 (ng/mL)   Relative Index 0.6  0.0 - 2.5   HEPATIC FUNCTION PANEL     Status: Normal   Collection Time   11/26/11 12:08 AM      Component Value Range   Total Protein 6.7  6.0 - 8.3 (g/dL)   Albumin 3.8  3.5 - 5.2 (g/dL)   AST 30  0 - 37 (U/L)   ALT 21  0 - 53 (U/L)   Alkaline Phosphatase 63  39 - 117 (U/L)   Total Bilirubin 0.4  0.3 - 1.2 (mg/dL)   Bilirubin, Direct 0.1  0.0 - 0.3 (mg/dL)   Indirect Bilirubin 0.3  0.3 - 0.9 (mg/dL)  MAGNESIUM     Status: Normal   Collection Time   11/26/11 12:08 AM      Component Value Range   Magnesium 2.1  1.5 - 2.5 (mg/dL)  URINALYSIS, ROUTINE W REFLEX MICROSCOPIC     Status: Abnormal   Collection Time   11/26/11  3:02 AM      Component Value Range   Color,  Urine YELLOW  YELLOW    APPearance CLEAR  CLEAR    Specific Gravity, Urine <1.005 (*) 1.005 - 1.030    pH 7.0  5.0 - 8.0    Glucose, UA NEGATIVE  NEGATIVE (mg/dL)   Hgb urine dipstick NEGATIVE  NEGATIVE    Bilirubin Urine NEGATIVE  NEGATIVE    Ketones, ur NEGATIVE  NEGATIVE (mg/dL)   Protein, ur NEGATIVE  NEGATIVE (mg/dL)   Urobilinogen, UA 0.2  0.0 - 1.0 (mg/dL)   Nitrite NEGATIVE  NEGATIVE    Leukocytes, UA NEGATIVE  NEGATIVE   PROTIME-INR      Status: Abnormal   Collection Time   11/26/11  5:04 AM      Component Value Range   Prothrombin Time 22.9 (*) 11.6 - 15.2 (seconds)   INR 1.99 (*) 0.00 - 1.49   BASIC METABOLIC PANEL     Status: Abnormal   Collection Time   11/26/11  5:04 AM      Component Value Range   Sodium 138  135 - 145 (mEq/L)   Potassium 3.6  3.5 - 5.1 (mEq/L)   Chloride 105  96 - 112 (mEq/L)   CO2 26  19 - 32 (mEq/L)   Glucose, Bld 163 (*) 70 - 99 (mg/dL)   BUN 13  6 - 23 (mg/dL)   Creatinine, Ser 1.61  0.50 - 1.35 (mg/dL)   Calcium 9.7  8.4 - 09.6 (mg/dL)   GFR calc non Af Amer >90  >90 (mL/min)   GFR calc Af Amer >90  >90 (mL/min)  CBC     Status: Abnormal   Collection Time   11/26/11  5:04 AM      Component Value Range   WBC 4.9  4.0 - 10.5 (K/uL)   RBC 4.41  4.22 - 5.81 (MIL/uL)   Hemoglobin 13.9  13.0 - 17.0 (g/dL)   HCT 04.5  40.9 - 81.1 (%)   MCV 90.0  78.0 - 100.0 (fL)   MCH 31.5  26.0 - 34.0 (pg)   MCHC 35.0  30.0 - 36.0 (g/dL)   RDW 91.4  78.2 - 95.6 (%)   Platelets 123 (*) 150 - 400 (K/uL)  CARDIAC PANEL(CRET KIN+CKTOT+MB+TROPI)     Status: Abnormal   Collection Time   11/26/11  7:50 AM      Component Value Range   Total CK 822 (*) 7 - 232 (U/L)   CK, MB 3.2  0.3 - 4.0 (ng/mL)   Troponin I <0.30  <0.30 (ng/mL)   Relative Index 0.4  0.0 - 2.5     Disposition: Home with wife  DIET: Cardiac  Follow-up Appts: Discharge Orders    Future Appointments: Provider: Department: Dept Phone: Center:   04/29/2012 9:30 AM [facility] [phone] None   05/01/2012 10:30 AM Randall An, MD [facility] [phone] None     Future Orders Please Complete By Expires   Diet - low sodium heart healthy      Increase activity slowly      Scheduling Instructions:   No contact sports until seen by primary care physician.   Discharge instructions      Comments:   Drink extra clear fluids for the next 3 days.  Please follow up with your primary care physician in 3-5 days to  have your CK level rechecked.   Other Restrictions      Comments:   No contact sports until seen by primary care physician and cardiologist   Call MD  for:  severe uncontrolled pain      Call MD for:  extreme fatigue      Call MD for:  persistant dizziness or light-headedness      Call MD for:  difficulty breathing, headache or visual disturbances      (HEART FAILURE PATIENTS) Call MD:  Anytime you have any of the following symptoms: 1) 3 pound weight gain in 24 hours or 5 pounds in 1 week 2) shortness of breath, with or without a dry hacking cough 3) swelling in the hands, feet or stomach 4) if you have to sleep on extra pillows at night in order to breathe.         Follow-up with PCP: Cassell Smiles., MD, MD in 3-7 days  Cardiologist: Dr. Tresa Endo [facility] in 1 week  Tests Needing Follow-up: Repeat CK level with PCP in 3-7 days   Signed: Clanford Johnson 11/26/2011, 9:40 AM  [phone]  Time spent 38 mins preparing discharge, orders, rxs, follow up.

## 2011-11-26 NOTE — Progress Notes (Signed)
Pt to be discharged home per MD order. Discharge instructions and appointments gone over with patient and family member at bedside. All questions and concerns answered. Pt discharged home.

## 2011-11-26 NOTE — Progress Notes (Signed)
Pt requested sleeping medication, MD made aware; will continue to monitor patient

## 2012-02-29 IMAGING — CR DG CHEST 2V
2 series · 2 of 2 positions shown · non-contrast
Comparison: 01/31/2007

CLINICAL DATA: Chest pain.

CHEST - 2 VIEW

[view not recorded (1 of 2)]
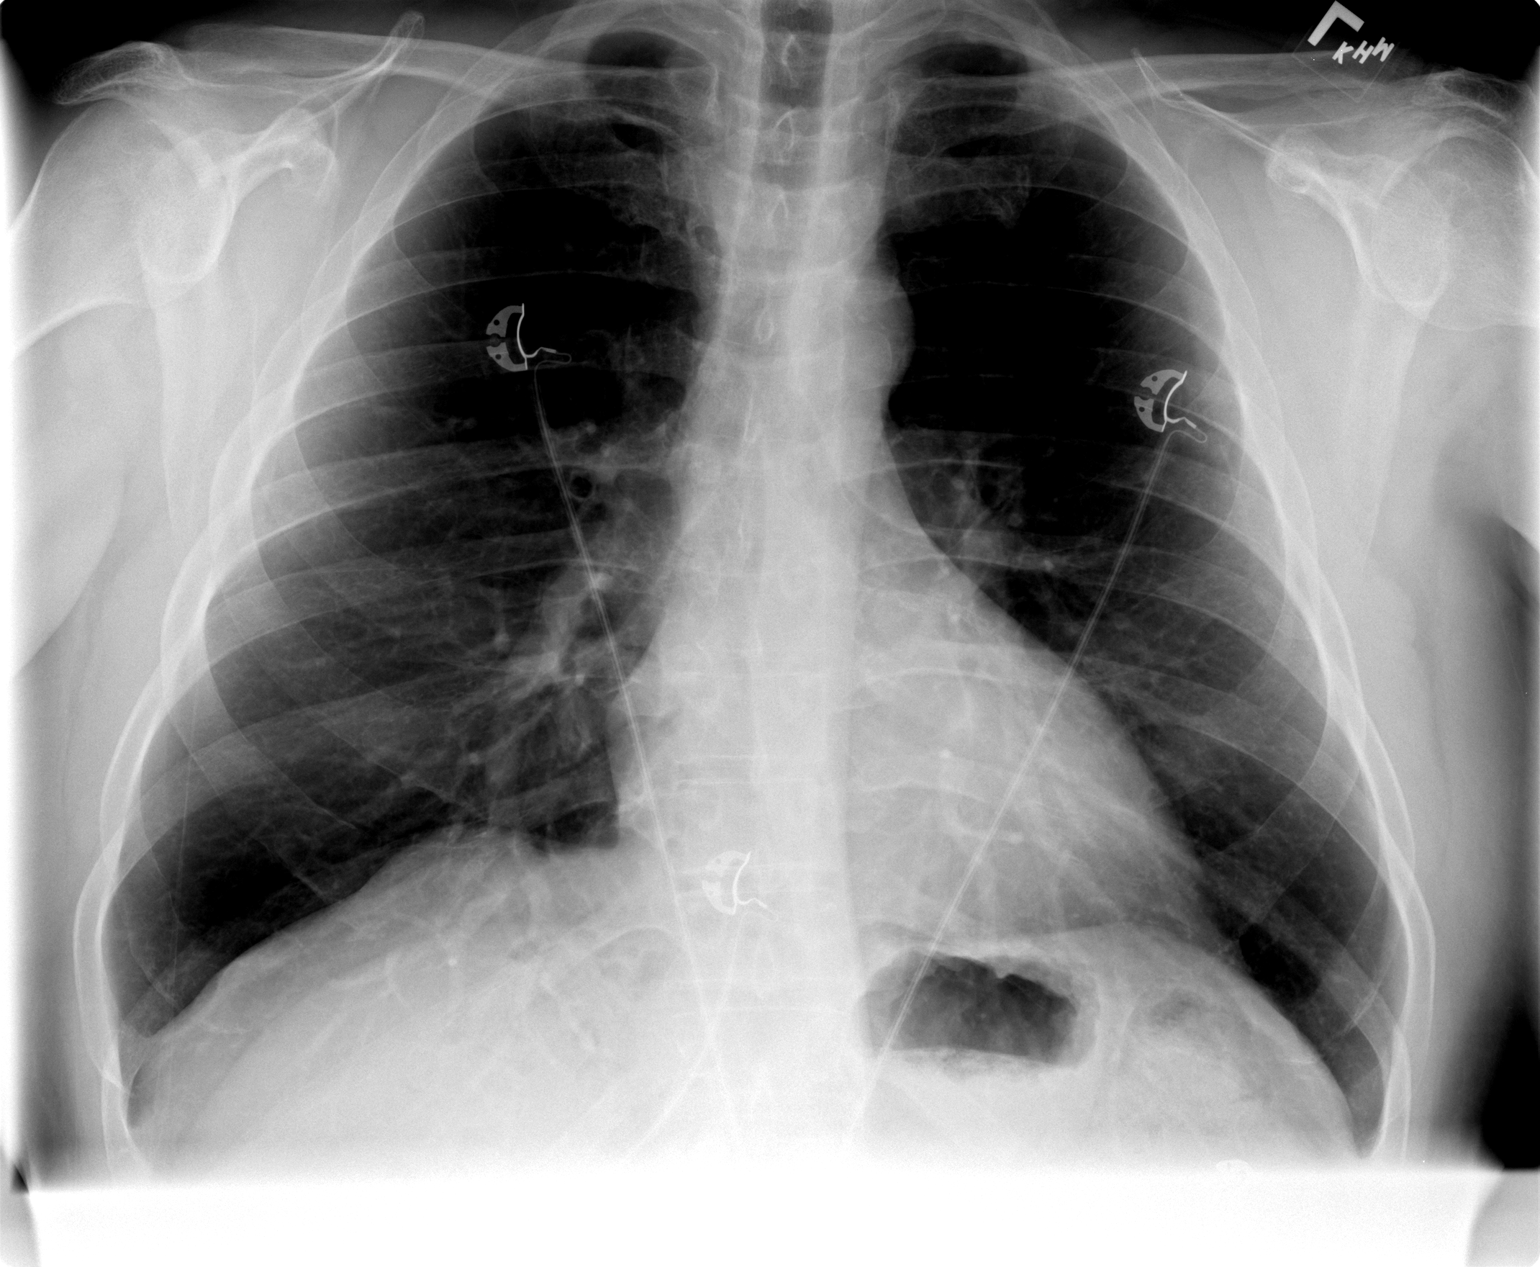

[view not recorded (2 of 2)]
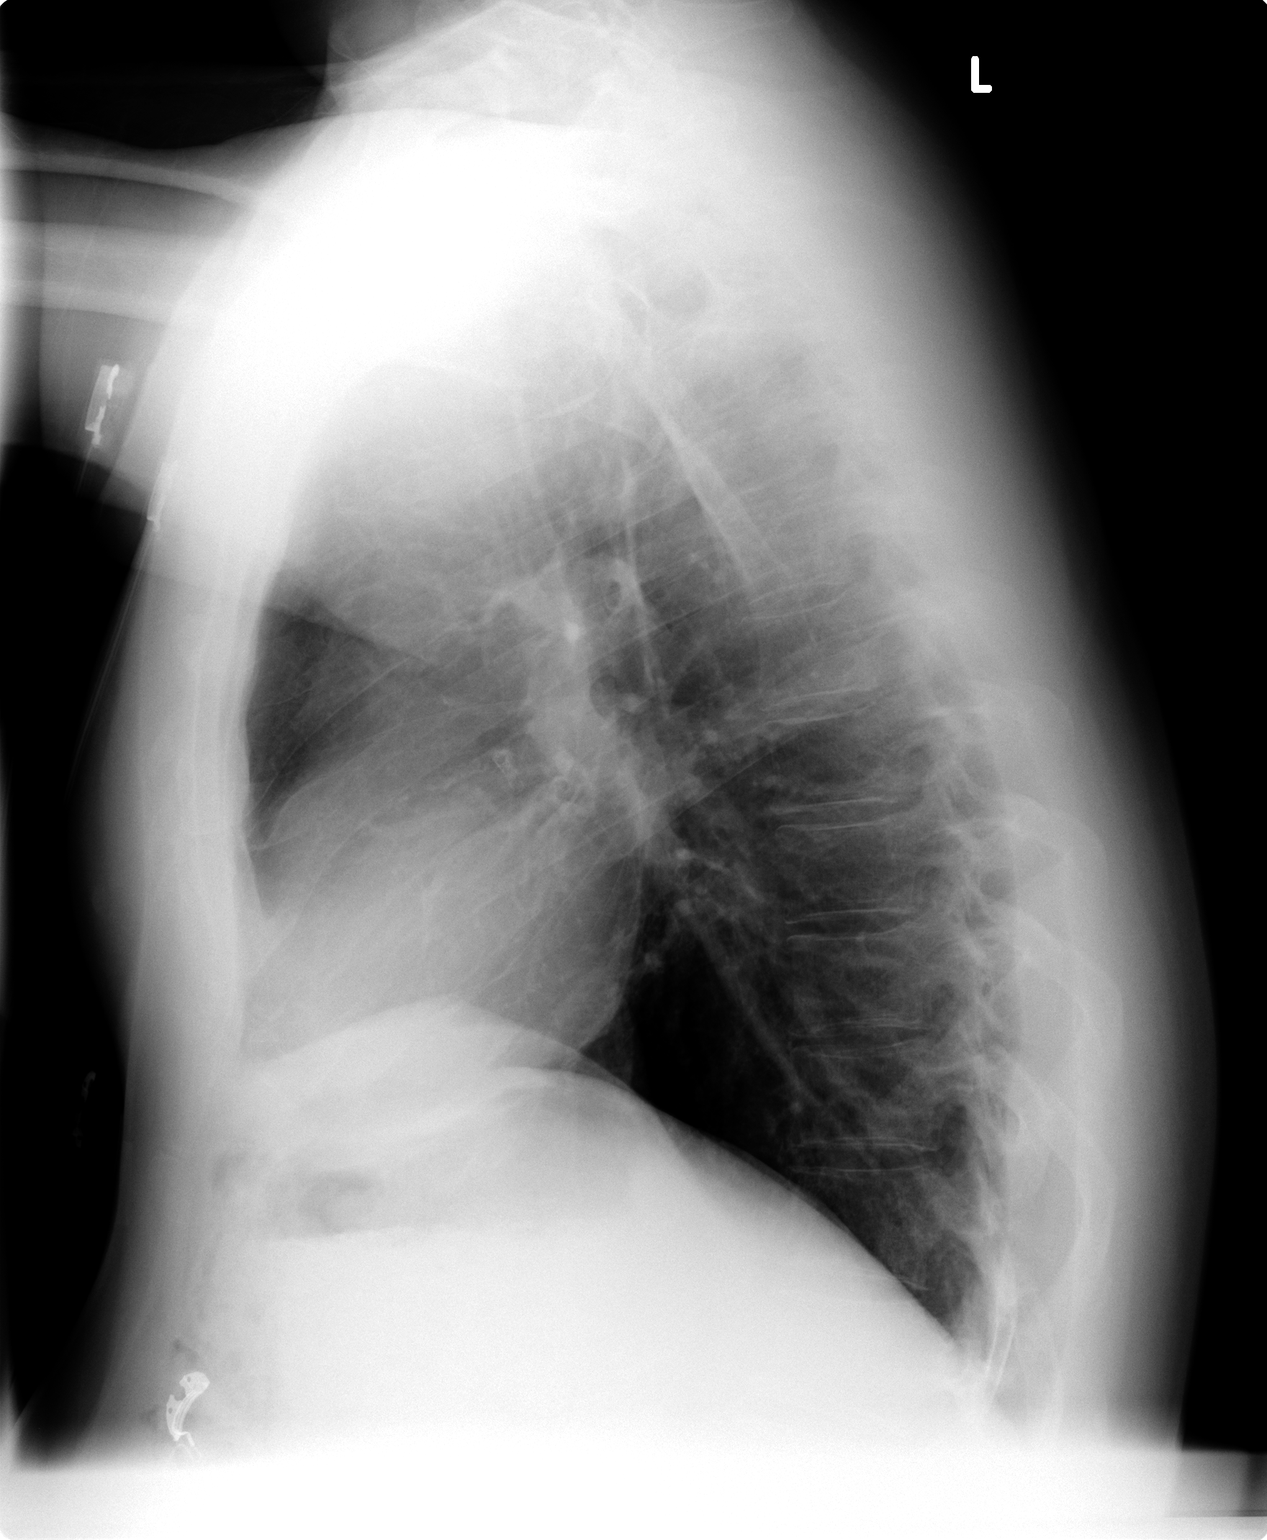

[2 of 2 positions shown; findings below may reference images not displayed]

FINDINGS: Cardiomediastinal silhouette is within normal limits.
Coronary stent is visible.  The lungs are free of focal
consolidations and pleural effusions. Visualized osseous structures
have a normal appearance.
IMPRESSION: Negative exam.

## 2012-04-29 ENCOUNTER — Encounter (HOSPITAL_COMMUNITY): Payer: Medicare Other | Attending: Oncology

## 2012-04-29 DIAGNOSIS — E669 Obesity, unspecified: Secondary | ICD-10-CM | POA: Insufficient documentation

## 2012-04-29 DIAGNOSIS — D696 Thrombocytopenia, unspecified: Secondary | ICD-10-CM | POA: Insufficient documentation

## 2012-04-29 DIAGNOSIS — E785 Hyperlipidemia, unspecified: Secondary | ICD-10-CM | POA: Insufficient documentation

## 2012-04-29 DIAGNOSIS — K7689 Other specified diseases of liver: Secondary | ICD-10-CM | POA: Insufficient documentation

## 2012-04-29 DIAGNOSIS — Q2111 Secundum atrial septal defect: Secondary | ICD-10-CM | POA: Insufficient documentation

## 2012-04-29 DIAGNOSIS — Q211 Atrial septal defect: Secondary | ICD-10-CM | POA: Insufficient documentation

## 2012-04-29 LAB — CBC
HCT: 40.8 % (ref 39.0–52.0)
MCHC: 35 g/dL (ref 30.0–36.0)
MCV: 87 fL (ref 78.0–100.0)
Platelets: 142 10*3/uL — ABNORMAL LOW (ref 150–400)
RDW: 12.2 % (ref 11.5–15.5)

## 2012-04-29 NOTE — Progress Notes (Signed)
Labs drawn today for cbc 

## 2012-05-01 ENCOUNTER — Encounter (HOSPITAL_BASED_OUTPATIENT_CLINIC_OR_DEPARTMENT_OTHER): Payer: Medicare Other | Admitting: Oncology

## 2012-05-01 ENCOUNTER — Encounter (HOSPITAL_COMMUNITY): Payer: Self-pay | Admitting: Oncology

## 2012-05-01 VITALS — BP 102/65 | HR 74 | Temp 98.1°F | Wt 173.2 lb

## 2012-05-01 DIAGNOSIS — D696 Thrombocytopenia, unspecified: Secondary | ICD-10-CM

## 2012-05-01 DIAGNOSIS — K7689 Other specified diseases of liver: Secondary | ICD-10-CM

## 2012-05-01 NOTE — Patient Instructions (Addendum)
[facility]  Discharge Instructions  RECOMMENDATIONS MADE BY THE CONSULTANT AND ANY TEST RESULTS WILL BE SENT TO YOUR REFERRING DOCTOR.   Return to clinic in September for lab work, CT scans and then to see MD.   I acknowledge that I have been informed and understand all the instructions given to me and received a copy. I do not have any more questions at this time, but understand that I may call the Specialty Clinic at [facility] at [phone] during business hours should I have any further questions or need assistance in obtaining follow-up care.    __________________________________________  _____________  __________ Signature of Patient or Authorized Representative            Date                   Time    __________________________________________ Nurse's Signature

## 2012-05-01 NOTE — Progress Notes (Signed)
Problem #1 mild thrombocytopenia  Problem #2 fatty infiltration of his liver  Problem #3 history of MI and CHF in the past  Problem #4 hyperlipidemia  Problem #5 is obesity he weighed over 200 pounds on a first saw him on a 5 foot 6 inch frame.  Problem #6 history of depression  Problem #7 history of patent foramen ovale  Onalee Hua platelets have slightly improved. He is in the low 140,000 range now. He was initially close to 100,000 when I met him but he has lost weight is now down to 173 pounds. He states that a month ago he weighed 162. He is trying to change his lifestyle. He eats a tremendous amount of free carbohydrates.  He is also insistent that we check his liver again in states that we said we would do that in September. There is no order for that and I did not have that in my note but if we're going to do anything I would like to do a CT scan to verify that his spleen is clearly not enlarged. He is very concerned about the "fatty liver" that was discussed with him in the past  We will see him in September after a CBC and a CT scan are performed and I have encouraged him to maintain his pursuit of weight reduction to approximately 160 pounds.

## 2012-07-22 ENCOUNTER — Ambulatory Visit (HOSPITAL_COMMUNITY)
Admission: RE | Admit: 2012-07-22 | Discharge: 2012-07-22 | Disposition: A | Discharge status: Home / Self Care | Payer: Medicare Other | Source: Ambulatory Visit | Attending: Oncology | Admitting: Oncology

## 2012-07-22 ENCOUNTER — Encounter (HOSPITAL_COMMUNITY): Payer: Medicare Other | Attending: Oncology

## 2012-07-22 DIAGNOSIS — D696 Thrombocytopenia, unspecified: Secondary | ICD-10-CM | POA: Insufficient documentation

## 2012-07-22 LAB — CBC
MCH: 30.6 pg (ref 26.0–34.0)
MCHC: 35.5 g/dL (ref 30.0–36.0)
Platelets: 149 10*3/uL — ABNORMAL LOW (ref 150–400)

## 2012-07-22 NOTE — Progress Notes (Signed)
Labs drawn today for cbc 

## 2012-07-30 ENCOUNTER — Encounter (HOSPITAL_COMMUNITY): Payer: Self-pay | Admitting: Oncology

## 2012-07-30 ENCOUNTER — Encounter (HOSPITAL_BASED_OUTPATIENT_CLINIC_OR_DEPARTMENT_OTHER): Payer: Medicare Other | Admitting: Oncology

## 2012-07-30 VITALS — BP 93/58 | HR 55 | Temp 97.8°F | Resp 16 | Wt 175.2 lb

## 2012-07-30 DIAGNOSIS — D696 Thrombocytopenia, unspecified: Secondary | ICD-10-CM

## 2012-07-30 NOTE — Progress Notes (Signed)
Problem #1 mild thrombocytopenia, unclear etiology but with improvement since he first started coming here and with no need for further intervention presently. Problem #2 fatty infiltration of his liver seen on ultrasound last year and it has now resolved by CT scan criteria with no evidence of liver disease or splenomegaly on his CT scan. Problem #3 obesity and he is now down to 175 pounds and he we'll try to get 155-170 pounds and stay there Problem #4 history of MI and CHF in the past  problem #5 History depression in the past Problem #6 history of hyperlipidemia Problem #7 history of patent foramen ovale he is doing very well presently. Interestingly his platelets are up to 149,000. His hemoglobin and white count remained fine. His CAT scan was mentioned above and shows no fatty infiltration of his liver which I have reviewed today with Dr. Tyron Russell in radiology. There is also no evidence for splenomegaly. So we will just watch his platelets and see him back in 6 or 7 months sooner if need be.

## 2012-07-30 NOTE — Patient Instructions (Addendum)
[facility]  Discharge Instructions  RECOMMENDATIONS MADE BY THE CONSULTANT AND ANY TEST RESULTS WILL BE SENT TO YOUR REFERRING DOCTOR.   EXAM FINDINGS BY MD TODAY AND SIGNS AND SYMPTOMS TO REPORT TO CLINIC OR PRIMARY MD: you are doing well.  Fatty liver has disappeared and platelets have improved.  Report unusual bruising or bleeding.  MEDICATIONS PRESCRIBED: none      SPECIAL INSTRUCTIONS/FOLLOW-UP: Lab work Needed in April and Return to Clinic in April after labs.   I acknowledge that I have been informed and understand all the instructions given to me and received a copy. I do not have any more questions at this time, but understand that I may call the Specialty Clinic at [facility] at [phone] during business hours should I have any further questions or need assistance in obtaining follow-up care.    __________________________________________  _____________  __________ Signature of Patient or Authorized Representative            Date                   Time    __________________________________________ Nurse's Signature

## 2012-12-06 ENCOUNTER — Other Ambulatory Visit (HOSPITAL_COMMUNITY): Payer: Self-pay | Admitting: Cardiovascular Disease

## 2012-12-06 DIAGNOSIS — I519 Heart disease, unspecified: Secondary | ICD-10-CM

## 2013-01-06 ENCOUNTER — Ambulatory Visit (HOSPITAL_COMMUNITY)
Admission: RE | Admit: 2013-01-06 | Discharge: 2013-01-06 | Disposition: A | Discharge status: Home / Self Care | Payer: Medicare Other | Source: Ambulatory Visit | Attending: Cardiovascular Disease | Admitting: Cardiovascular Disease

## 2013-01-06 DIAGNOSIS — G4730 Sleep apnea, unspecified: Secondary | ICD-10-CM | POA: Insufficient documentation

## 2013-01-06 DIAGNOSIS — I059 Rheumatic mitral valve disease, unspecified: Secondary | ICD-10-CM | POA: Insufficient documentation

## 2013-01-06 DIAGNOSIS — I2510 Atherosclerotic heart disease of native coronary artery without angina pectoris: Secondary | ICD-10-CM | POA: Insufficient documentation

## 2013-01-06 DIAGNOSIS — I079 Rheumatic tricuspid valve disease, unspecified: Secondary | ICD-10-CM | POA: Insufficient documentation

## 2013-01-06 DIAGNOSIS — I519 Heart disease, unspecified: Secondary | ICD-10-CM

## 2013-01-06 DIAGNOSIS — Z8673 Personal history of transient ischemic attack (TIA), and cerebral infarction without residual deficits: Secondary | ICD-10-CM | POA: Insufficient documentation

## 2013-01-06 NOTE — Progress Notes (Signed)
2D Echo Performed 01/06/2013    Tayjon Halladay, RCS  

## 2013-02-17 ENCOUNTER — Encounter (HOSPITAL_COMMUNITY): Payer: Medicare Other | Attending: Oncology

## 2013-02-17 DIAGNOSIS — I509 Heart failure, unspecified: Secondary | ICD-10-CM | POA: Insufficient documentation

## 2013-02-17 DIAGNOSIS — D696 Thrombocytopenia, unspecified: Secondary | ICD-10-CM | POA: Insufficient documentation

## 2013-02-17 DIAGNOSIS — E785 Hyperlipidemia, unspecified: Secondary | ICD-10-CM | POA: Insufficient documentation

## 2013-02-17 DIAGNOSIS — K7689 Other specified diseases of liver: Secondary | ICD-10-CM | POA: Insufficient documentation

## 2013-02-17 LAB — CBC
MCH: 30.4 pg (ref 26.0–34.0)
MCHC: 35.2 g/dL (ref 30.0–36.0)
Platelets: 147 10*3/uL — ABNORMAL LOW (ref 150–400)
RDW: 12.6 % (ref 11.5–15.5)

## 2013-02-17 NOTE — Progress Notes (Signed)
Labs drawn today for cbc 

## 2013-02-19 ENCOUNTER — Encounter (HOSPITAL_BASED_OUTPATIENT_CLINIC_OR_DEPARTMENT_OTHER): Payer: Medicare Other | Admitting: Oncology

## 2013-02-19 ENCOUNTER — Encounter (HOSPITAL_COMMUNITY): Payer: Self-pay | Admitting: Oncology

## 2013-02-19 VITALS — BP 122/62 | HR 60 | Temp 98.3°F | Resp 16 | Wt 181.8 lb

## 2013-02-19 DIAGNOSIS — R635 Abnormal weight gain: Secondary | ICD-10-CM

## 2013-02-19 DIAGNOSIS — D696 Thrombocytopenia, unspecified: Secondary | ICD-10-CM

## 2013-02-19 DIAGNOSIS — K7689 Other specified diseases of liver: Secondary | ICD-10-CM

## 2013-02-19 NOTE — Progress Notes (Signed)
Number 1 mild thrombocytopenia which is stable. There is no obvious etiology and if he deteriorates we will check an anemia panel but have held off on that since he is stable and his hemoglobin and white count have not been an issue. #2 fatty infiltration of his liver #3 excess weight #4 history of MI and CHF in the past #5 history depression in the past #6 history of hyperlipidemia #7 history of patent foramen ovale  His platelets remain stable. His hemoglobin range stable and his white count remained stable. He has had some personal issues at home and he has been stress eating. His weight is back up to 181 pounds. He was 175 pounds with his last visit.  I don't think there is anything to do more in the way of his workup just yet. I think he does need to be observed. We'll see him back in 6 months and 12 months for lab work and I'll see him in 12 months. We may need to do vitamin levels etc. if he deteriorates from the but the standpoint. His MCV, hemoglobin, hematocrit, white count, have all been well within the normal range.

## 2013-02-19 NOTE — Patient Instructions (Addendum)
[facility] Discharge Instructions  RECOMMENDATIONS MADE BY THE CONSULTANT AND ANY TEST RESULTS WILL BE SENT TO YOUR REFERRING PHYSICIAN.  EXAM FINDINGS BY THE PHYSICIAN TODAY AND SIGNS OR SYMPTOMS TO REPORT TO CLINIC OR PRIMARY PHYSICIAN: Discussion by MD.  Your platelet count is stable.  MEDICATIONS PRESCRIBED:  none  INSTRUCTIONS GIVEN AND DISCUSSED: Report any unusual briusing or bleeding.  SPECIAL INSTRUCTIONS/FOLLOW-UP: Blood work in 6 and 12 months and to see MD in 12 months.  Thank you for choosing [facility] to provide your oncology and hematology care.  To afford each patient quality time with our providers, please arrive at least 15 minutes before your scheduled appointment time.  With your help, our goal is to use those 15 minutes to complete the necessary work-up to ensure our physicians have the information they need to help with your evaluation and healthcare recommendations.    Effective January 1st, 2014, we ask that you re-schedule your appointment with our physicians should you arrive 10 or more minutes late for your appointment.  We strive to give you quality time with our providers, and arriving late affects you and other patients whose appointments are after yours.    Again, thank you for choosing [facility].  Our hope is that these requests will decrease the amount of time that you wait before being seen by our physicians.       _____________________________________________________________  Should you have questions after your visit to [facility], please contact our office at [phone] between the hours of 8:30 a.m. and 5:00 p.m.  Voicemails left after 4:30 p.m. will not be returned until the following business day.  For prescription refill requests, have your pharmacy contact our office with your prescription refill request.

## 2013-04-15 ENCOUNTER — Telehealth: Payer: Self-pay | Admitting: Cardiovascular Disease

## 2013-04-15 NOTE — Telephone Encounter (Signed)
Returned call and spoke w/ pt.  Stated his wife called and he doesn't know the questions she had about his medication.  Stated he will have her call back.  Advised she call back today before 4pm.  Agreed.

## 2013-04-15 NOTE — Telephone Encounter (Signed)
Need clarification on the dosage of his Lisinopril please!

## 2013-04-17 ENCOUNTER — Telehealth: Payer: Self-pay | Admitting: Cardiovascular Disease

## 2013-04-17 NOTE — Telephone Encounter (Signed)
Returned call and spoke w/ [name], pt's wife.  Wanted to know what dose of lisinopril pt is taking and informed per last OV note, pt is taking 20mg .  Wife stated pt's PCP writes [facility] and Dr. Tresa Endo so she wanted to make sure pt is on the right dose.

## 2013-04-17 NOTE — Telephone Encounter (Signed)
[name] (wife) is needing verification on dosage of the Lisinopril  that [name] [name] is taking. She is asking if it could be written down and she come by to pick it up .Marland Kitchen Please call at [phone]

## 2013-05-26 ENCOUNTER — Ambulatory Visit: Payer: Medicare Other | Admitting: Cardiovascular Disease

## 2013-06-18 ENCOUNTER — Ambulatory Visit: Payer: Medicare Other | Admitting: Cardiovascular Disease

## 2013-06-24 ENCOUNTER — Ambulatory Visit (INDEPENDENT_AMBULATORY_CARE_PROVIDER_SITE_OTHER): Payer: Medicare Other | Admitting: Cardiovascular Disease

## 2013-06-24 ENCOUNTER — Encounter: Payer: Self-pay | Admitting: Cardiovascular Disease

## 2013-06-24 VITALS — BP 130/78 | Ht 66.0 in | Wt 192.1 lb

## 2013-06-24 DIAGNOSIS — E785 Hyperlipidemia, unspecified: Secondary | ICD-10-CM

## 2013-06-24 DIAGNOSIS — F319 Bipolar disorder, unspecified: Secondary | ICD-10-CM

## 2013-06-24 DIAGNOSIS — I10 Essential (primary) hypertension: Secondary | ICD-10-CM

## 2013-06-24 DIAGNOSIS — I252 Old myocardial infarction: Secondary | ICD-10-CM

## 2013-06-24 DIAGNOSIS — I2510 Atherosclerotic heart disease of native coronary artery without angina pectoris: Secondary | ICD-10-CM

## 2013-06-24 NOTE — Patient Instructions (Addendum)
Your physician recommends that you schedule a follow-up appointment in: AS NEEDED  

## 2013-06-29 ENCOUNTER — Encounter: Payer: Self-pay | Admitting: Cardiovascular Disease
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
## 2013-08-25 ENCOUNTER — Other Ambulatory Visit (HOSPITAL_COMMUNITY): Payer: Medicare Other

## 2014-02-19 ENCOUNTER — Other Ambulatory Visit (HOSPITAL_COMMUNITY): Payer: Medicare Other

## 2014-02-20 ENCOUNTER — Ambulatory Visit (HOSPITAL_COMMUNITY): Payer: Medicare Other

## 2014-02-21 NOTE — Progress Notes (Signed)
This encounter was created in error - please disregard.

## 2014-02-24 ENCOUNTER — Encounter (HOSPITAL_COMMUNITY): Payer: Self-pay

## 2015-05-28 ENCOUNTER — Encounter: Payer: Self-pay | Admitting: Cardiovascular Disease

## 2016-05-17 ENCOUNTER — Encounter: Payer: Self-pay | Admitting: Gastroenterology
